# Patient Record
Sex: Male | Born: 1940 | Race: White | Hispanic: No | Marital: Married | Smoking: Never smoker
Health system: Southern US, Community
[De-identification: ages and names within clinical notes are randomized; demographics above are authoritative.]

## PROBLEM LIST (undated history)

## (undated) DIAGNOSIS — I2510 Atherosclerotic heart disease of native coronary artery without angina pectoris: Secondary | ICD-10-CM

## (undated) DIAGNOSIS — T8859XA Other complications of anesthesia, initial encounter: Secondary | ICD-10-CM

## (undated) DIAGNOSIS — I209 Angina pectoris, unspecified: Secondary | ICD-10-CM

## (undated) DIAGNOSIS — T753XXA Motion sickness, initial encounter: Secondary | ICD-10-CM

## (undated) DIAGNOSIS — I10 Essential (primary) hypertension: Secondary | ICD-10-CM

## (undated) DIAGNOSIS — R112 Nausea with vomiting, unspecified: Secondary | ICD-10-CM

## (undated) DIAGNOSIS — E119 Type 2 diabetes mellitus without complications: Secondary | ICD-10-CM

## (undated) DIAGNOSIS — Z972 Presence of dental prosthetic device (complete) (partial): Secondary | ICD-10-CM

## (undated) DIAGNOSIS — Z9889 Other specified postprocedural states: Secondary | ICD-10-CM

## (undated) DIAGNOSIS — K219 Gastro-esophageal reflux disease without esophagitis: Secondary | ICD-10-CM

## (undated) DIAGNOSIS — E785 Hyperlipidemia, unspecified: Secondary | ICD-10-CM

## (undated) DIAGNOSIS — T4145XA Adverse effect of unspecified anesthetic, initial encounter: Secondary | ICD-10-CM

## (undated) HISTORY — PX: NASAL SINUS SURGERY: SHX719

## (undated) HISTORY — PX: CARDIAC CATHETERIZATION: SHX172

## (undated) HISTORY — PX: CORONARY ANGIOPLASTY: SHX604

## (undated) HISTORY — PX: BACK SURGERY: SHX140

## (undated) HISTORY — PX: TONSILLECTOMY: SUR1361

## (undated) HISTORY — PX: COLONOSCOPY: SHX174

---
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
## 2005-07-30 ENCOUNTER — Ambulatory Visit: Payer: Self-pay | Admitting: Unknown Physician Specialty

## 2010-08-27 ENCOUNTER — Ambulatory Visit: Payer: Self-pay | Admitting: Unknown Physician Specialty

## 2010-08-28 LAB — PATHOLOGY REPORT

## 2014-02-16 ENCOUNTER — Ambulatory Visit: Payer: Self-pay | Admitting: Physician Assistant

## 2014-02-19 ENCOUNTER — Ambulatory Visit: Payer: Self-pay | Admitting: Family Medicine

## 2014-02-23 LAB — WOUND CULTURE

## 2014-09-09 ENCOUNTER — Other Ambulatory Visit: Payer: Self-pay | Admitting: Cardiology

## 2014-09-10 ENCOUNTER — Encounter
Admission: RE | Disposition: A | Discharge status: Home / Self Care | Payer: Self-pay | Source: Ambulatory Visit | Attending: Cardiology

## 2014-09-10 SURGERY — LEFT HEART CATH AND CORONARY ANGIOGRAPHY
Anesthesia: LOCAL

## 2014-09-10 NOTE — H&P (Signed)
Progress Notes   Isaac Ruiz (MR# [id number])        Progress Notes Info     Author Note Status Last Update User Last Update Date/Time   Isaac Cowman, MD Signed Isaac Cowman, MD Mon Sep 09, 2014 9:59 AM    Progress Notes    Expand All Collapse All   Established Patient Visit   Chief Complaint: Chief Complaint  Patient presents with  . Follow-up    ETT  . Chest Pain    still has cp could not hardly stand it   Date of Service: 09/09/2014 Date of Birth: 1940-12-23 PCP: Isaac Lema, MD  History of Present Illness: Mr. Isaac Ruiz is a 74 y.o.male patient who returns for coronary artery disease, hyperlipidemia, essential hypertension and type 2 diabetes. The patient is status post angioplasty August, 2001 and be BMS D1 and PDA 06/28/2000. Patient was seen 08/26/2014 with 3 week history of recurrent episodes of chest pain. During one episode the patient was evaluated at [facility] and [place]. The patient was started on isosorbide mononitrate 30 mg daily which has helped. However, the patient continued experience recurrent episodes of chest discomfort, typically after exertion, relieved with sublingual nitroglycerin. The patient describes substernal chest discomfort, associated with shortness of breath, without radiation, nausea, vomiting or diaphoresis. The patient underwent ETT sestamibi study on 08/29/2014, exercise 6 minutes and 1 seconds on a Bruce protocol, with mild chest pain without ECG changes. Gated scintigraphy revealed LV ejection fraction of 54%. SPECT imaging revealed a large fixed inferior wall defect without evidence for ischemia.  The patient has essential hypertension, blood pressure under good control today, only on lisinopril, tolerated well without apparent side effects. The patient tries to adhere to a no added salt diet.  The patient has hyperlipidemia, currently diet controlled.  The patient has type 2  diabetes, on oral agents, taking glipizide and Januvia, which are well tolerated without apparent side effects. The patient currently is not experiencing any symptoms directly related to diabetes. The patient adheres to a low carbohydrate, diabetic diet.  Past Medical and Surgical History  Past Medical History Past Medical History  Diagnosis Date  . GERD (gastroesophageal reflux disease)   . Vitamin D deficiency disease   . CAD (coronary artery disease)   . Diabetes mellitus type 2, uncomplicated   . Hypertension   . Pure hypercholesterolemia 01/04/2012    Followed by Dr. Arther Ruiz, does not tolerate medications.    Past Surgical History He has past surgical history that includes Coronary angioplasty; Laminectomy; and Sinus surgery.   Medications and Allergies  Current Medications   Current Medications    Current Outpatient Prescriptions  Medication Sig Dispense Refill  . aspirin 81 mg tablet Take 81 mg by mouth daily.    . blood glucose diagnostic test strip Use 1 each once daily. ACCU-CHEK AVIVA PLUS 100 each 99  . GLIPIZIDE ORAL Take 20 mg by mouth 2 (two) times daily. Take 1 tablets in the morning and two tablets at night- [facility] medication    . isosorbide mononitrate (IMDUR) 30 MG ER tablet Take 1 tablet (30 mg total) by mouth once daily. 30 tablet 6  . lisinopril (PRINIVIL,ZESTRIL) 40 MG tablet Take 40 mg by mouth daily.    . nitroGLYcerin (NITROSTAT) 0.4 MG SL tablet Place 1 tablet (0.4 mg total) under the tongue every 5 (five) minutes as needed for Chest pain. May take up to 3 doses. 25 tablet 11  . pantoprazole (PROTONIX) 20 MG DR  tablet Take 20 mg by mouth.    . ranitidine (ZANTAC) 150 MG tablet Take 150 mg by mouth as needed.    . SENNOSIDES ORAL Take by mouth. BID from GI    . sitaGLIPtin (JANUVIA) 100 MG tablet Take 100 mg by mouth once daily. [facility] medication- HOLD per patient.     No  current facility-administered medications for this visit.      Allergies: Atenolol; Cardura; Fenofibrate; Fluvastatin; Glucophage; Hydrochlorothiazide; Lipitor; Metoprolol; Niacin; Pioglitazone; Pravastatin; Rosuvastatin; Simvastatin; Vitamin d2; and Zetia  Social and Family History  Social History  reports that he has never smoked. He has never used smokeless tobacco. He reports that he does not drink alcohol or use illicit drugs.  Family History Family History  Problem Relation Age of Onset  . Breast cancer Mother   . Coronary artery disease Father     Review of Systems   Review of Systems: The patient reports recurrent exertional chest pain, associated with shortness of breath, without orthopnea, paroxysmal nocturnal dyspnea, pedal edema, palpitations, heart racing, presyncope, syncope. Review of 12 Systems is negative except as described above.  Physical Examination   Vitals:BP 132/70 mmHg  Pulse 56  Ht 175.3 cm (5\' 9" )  Wt 78.019 kg (172 lb)  BMI 25.39 kg/m2 Ht:175.3 cm (5\' 9" ) Wt:78.019 kg (172 lb) JKK:XFGH surface area is 1.95 meters squared. Body mass index is 25.39 kg/(m^2).  HEENT: Pupils equally reactive to light and accomodation Neck: Supple without thyromegaly, carotid pulses 2+ Lungs: clear to auscultation bilaterally; no wheezes, rales, rhonchi Heart: Regular rate and rhythm. No gallops, murmurs or rub Abdomen: soft nontender, nondistended, with normal bowel sounds Extremities: no cyanosis, clubbing, or edema Peripheral Pulses: 2+ in all extremities, 2+ femoral pulses bilaterally  Assessment   74 y.o. male with  1. Essential hypertension with goal blood pressure less than 140/90   2. Essential hypertension   3. Coronary artery disease involving native coronary artery of native heart with unstable angina pectoris   4. Diabetes mellitus type 2, uncomplicated   5. Pure hypercholesterolemia- chronic, intolerant of meds,  has even seen dr. Arther Ruiz   6. Pure hypercholesterolemia   7. H/O angioplasty    74 year old gentleman with known coronary artery disease, status post prior PTCA and stents, with recurrent episodes of exertional chest pain. ETT sestamibi study revealed a large fixed inferior wall defect without significant ischemia. The patient has [place] class 3 chest pain with moderate risk stress test result. The patient has essential hypertension, blood pressure under good control on current medications which a tolerated well without apparent side effects. The patient has type 2 diabetes, currently on oral agents, adhering to a low carbohydrate diet.    Plan   1. Continue current medications 2. Increase isosorbide mononitrate to 30 mg b.i.d. 3. Counseled patient about low-sodium diet 4. DASH diet printed instructions given to the patient 5. Counseled patient about low-cholesterol diet 6. Low-fat and cholesterol diet printed instructions given to the patient 7. Diabetes diet printed instructions given to the patient 8. Proceed with cardiac catheterization with selective coronary arteriography. The risks, benefits alternatives of cardiac catheterization were explained to the patient and informed written consent was obtained.  No orders of the defined types were placed in this encounter.   No Follow-up on file.  Isaac Ruiz, [facility]   [address] [place] [address]    Department  Name Address Phone [facility] [place] [address] [phone]     Progress Notes   Isaac Ruiz (MR# [id number])        Progress Notes Info     Author Note Status Last Update User Last Update Date/Time   Isaac Cowman, MD Signed Isaac Cowman, MD Mon Sep 09, 2014 9:59 AM    Progress Notes    Expand All Collapse All   Established Patient Visit   Chief  Complaint: Chief Complaint  Patient presents with  . Follow-up    ETT  . Chest Pain    still has cp could not hardly stand it   Date of Service: 09/09/2014 Date of Birth: February 08, 1941 PCP: Isaac Lema, MD  History of Present Illness: Mr. Stroschein is a 74 y.o.male patient who returns for coronary artery disease, hyperlipidemia, essential hypertension and type 2 diabetes. The patient is status post angioplasty August, 2001 and be BMS D1 and PDA 06/28/2000. Patient was seen 08/26/2014 with 3 week history of recurrent episodes of chest pain. During one episode the patient was evaluated at [facility] and [place]. The patient was started on isosorbide mononitrate 30 mg daily which has helped. However, the patient continued experience recurrent episodes of chest discomfort, typically after exertion, relieved with sublingual nitroglycerin. The patient describes substernal chest discomfort, associated with shortness of breath, without radiation, nausea, vomiting or diaphoresis. The patient underwent ETT sestamibi study on 08/29/2014, exercise 6 minutes and 1 seconds on a Bruce protocol, with mild chest pain without ECG changes. Gated scintigraphy revealed LV ejection fraction of 54%. SPECT imaging revealed a large fixed inferior wall defect without evidence for ischemia.  The patient has essential hypertension, blood pressure under good control today, only on lisinopril, tolerated well without apparent side effects. The patient tries to adhere to a no added salt diet.  The patient has hyperlipidemia, currently diet controlled.  The patient has type 2 diabetes, on oral agents, taking glipizide and Januvia, which are well tolerated without apparent side effects. The patient currently is not experiencing any symptoms directly related to diabetes. The patient adheres to a low carbohydrate, diabetic diet.  Past Medical and Surgical History  Past Medical History Past  Medical History  Diagnosis Date  . GERD (gastroesophageal reflux disease)   . Vitamin D deficiency disease   . CAD (coronary artery disease)   . Diabetes mellitus type 2, uncomplicated   . Hypertension   . Pure hypercholesterolemia 01/04/2012    Followed by Dr. Arther Ruiz, does not tolerate medications.    Past Surgical History He has past surgical history that includes Coronary angioplasty; Laminectomy; and Sinus surgery.   Medications and Allergies  Current Medications   Current Medications    Current Outpatient Prescriptions  Medication Sig Dispense Refill  . aspirin 81 mg tablet Take 81 mg by mouth daily.    . blood glucose diagnostic test strip Use 1 each once daily. ACCU-CHEK AVIVA PLUS 100 each 99  . GLIPIZIDE ORAL Take 20 mg by mouth 2 (two) times daily. Take 1 tablets in the morning and two tablets at night- [facility] medication    . isosorbide mononitrate (IMDUR) 30 MG ER tablet Take 1 tablet (30 mg total) by mouth once daily. 30 tablet 6  . lisinopril (PRINIVIL,ZESTRIL) 40 MG tablet Take 40 mg by mouth daily.    . nitroGLYcerin (NITROSTAT) 0.4 MG SL tablet Place 1 tablet (0.4 mg total) under the tongue every 5 (five)  minutes as needed for Chest pain. May take up to 3 doses. 25 tablet 11  . pantoprazole (PROTONIX) 20 MG DR tablet Take 20 mg by mouth.    . ranitidine (ZANTAC) 150 MG tablet Take 150 mg by mouth as needed.    . SENNOSIDES ORAL Take by mouth. BID from GI    . sitaGLIPtin (JANUVIA) 100 MG tablet Take 100 mg by mouth once daily. [facility] medication- HOLD per patient.     No current facility-administered medications for this visit.      Allergies: Atenolol; Cardura; Fenofibrate; Fluvastatin; Glucophage; Hydrochlorothiazide; Lipitor; Metoprolol; Niacin; Pioglitazone; Pravastatin; Rosuvastatin; Simvastatin; Vitamin d2; and Zetia  Social and Family History  Social History  reports that he  has never smoked. He has never used smokeless tobacco. He reports that he does not drink alcohol or use illicit drugs.  Family History Family History  Problem Relation Age of Onset  . Breast cancer Mother   . Coronary artery disease Father     Review of Systems   Review of Systems: The patient reports recurrent exertional chest pain, associated with shortness of breath, without orthopnea, paroxysmal nocturnal dyspnea, pedal edema, palpitations, heart racing, presyncope, syncope. Review of 12 Systems is negative except as described above.  Physical Examination   Vitals:BP 132/70 mmHg  Pulse 56  Ht 175.3 cm (5\' 9" )  Wt 78.019 kg (172 lb)  BMI 25.39 kg/m2 Ht:175.3 cm (5\' 9" ) Wt:78.019 kg (172 lb) BVQ:XIHW surface area is 1.95 meters squared. Body mass index is 25.39 kg/(m^2).  HEENT: Pupils equally reactive to light and accomodation Neck: Supple without thyromegaly, carotid pulses 2+ Lungs: clear to auscultation bilaterally; no wheezes, rales, rhonchi Heart: Regular rate and rhythm. No gallops, murmurs or rub Abdomen: soft nontender, nondistended, with normal bowel sounds Extremities: no cyanosis, clubbing, or edema Peripheral Pulses: 2+ in all extremities, 2+ femoral pulses bilaterally  Assessment   74 y.o. male with  1. Essential hypertension with goal blood pressure less than 140/90   2. Essential hypertension   3. Coronary artery disease involving native coronary artery of native heart with unstable angina pectoris   4. Diabetes mellitus type 2, uncomplicated   5. Pure hypercholesterolemia- chronic, intolerant of meds, has even seen dr. Arther Ruiz   6. Pure hypercholesterolemia   7. H/O angioplasty    74 year old gentleman with known coronary artery disease, status post prior PTCA and stents, with recurrent episodes of exertional chest pain. ETT sestamibi study revealed a large fixed inferior wall defect without significant ischemia.  The patient has [place] class 3 chest pain with moderate risk stress test result. The patient has essential hypertension, blood pressure under good control on current medications which a tolerated well without apparent side effects. The patient has type 2 diabetes, currently on oral agents, adhering to a low carbohydrate diet.    Plan   1. Continue current medications 2. Increase isosorbide mononitrate to 30 mg b.i.d. 3. Counseled patient about low-sodium diet 4. DASH diet printed instructions given to the patient 5. Counseled patient about low-cholesterol diet 6. Low-fat and cholesterol diet printed instructions given to the patient 7. Diabetes diet printed instructions given to the patient 8. Proceed with cardiac catheterization with selective coronary arteriography. The risks, benefits alternatives of cardiac catheterization were explained to the patient and informed written consent was obtained.  No orders of the defined types were placed in this encounter.   No Follow-up on file.  Isaac Cowman, MD  [facility]   [place] [address]    Department    Name Address Phone Fax   [facility] [place] [address] [phone]

## 2014-09-11 ENCOUNTER — Observation Stay
Admission: RE | Admit: 2014-09-11 | Discharge: 2014-09-12 | Disposition: A | Discharge status: Home / Self Care | Payer: Medicare HMO | Source: Ambulatory Visit | Attending: Cardiology | Admitting: Cardiology

## 2014-09-11 ENCOUNTER — Encounter
Admission: RE | Disposition: A | Discharge status: Home / Self Care | Payer: Medicare HMO | Source: Ambulatory Visit | Attending: Cardiology

## 2014-09-11 ENCOUNTER — Encounter: Payer: Self-pay | Admitting: *Deleted

## 2014-09-11 DIAGNOSIS — Z803 Family history of malignant neoplasm of breast: Secondary | ICD-10-CM | POA: Insufficient documentation

## 2014-09-11 DIAGNOSIS — R079 Chest pain, unspecified: Secondary | ICD-10-CM | POA: Diagnosis not present

## 2014-09-11 DIAGNOSIS — Z8249 Family history of ischemic heart disease and other diseases of the circulatory system: Secondary | ICD-10-CM | POA: Insufficient documentation

## 2014-09-11 DIAGNOSIS — Z9861 Coronary angioplasty status: Secondary | ICD-10-CM | POA: Diagnosis not present

## 2014-09-11 DIAGNOSIS — R0601 Orthopnea: Secondary | ICD-10-CM | POA: Diagnosis not present

## 2014-09-11 DIAGNOSIS — Z79899 Other long term (current) drug therapy: Secondary | ICD-10-CM | POA: Diagnosis not present

## 2014-09-11 DIAGNOSIS — E119 Type 2 diabetes mellitus without complications: Secondary | ICD-10-CM | POA: Diagnosis not present

## 2014-09-11 DIAGNOSIS — E785 Hyperlipidemia, unspecified: Secondary | ICD-10-CM | POA: Insufficient documentation

## 2014-09-11 DIAGNOSIS — K219 Gastro-esophageal reflux disease without esophagitis: Secondary | ICD-10-CM | POA: Diagnosis not present

## 2014-09-11 DIAGNOSIS — I10 Essential (primary) hypertension: Secondary | ICD-10-CM | POA: Diagnosis not present

## 2014-09-11 DIAGNOSIS — Z7982 Long term (current) use of aspirin: Secondary | ICD-10-CM | POA: Insufficient documentation

## 2014-09-11 DIAGNOSIS — E780 Pure hypercholesterolemia: Secondary | ICD-10-CM | POA: Diagnosis not present

## 2014-09-11 DIAGNOSIS — E559 Vitamin D deficiency, unspecified: Secondary | ICD-10-CM | POA: Diagnosis not present

## 2014-09-11 DIAGNOSIS — R0600 Dyspnea, unspecified: Secondary | ICD-10-CM | POA: Diagnosis not present

## 2014-09-11 DIAGNOSIS — I25110 Atherosclerotic heart disease of native coronary artery with unstable angina pectoris: Principal | ICD-10-CM | POA: Diagnosis present

## 2014-09-11 HISTORY — DX: Atherosclerotic heart disease of native coronary artery without angina pectoris: I25.10

## 2014-09-11 HISTORY — DX: Type 2 diabetes mellitus without complications: E11.9

## 2014-09-11 HISTORY — PX: CARDIAC CATHETERIZATION: SHX172

## 2014-09-11 HISTORY — DX: Angina pectoris, unspecified: I20.9

## 2014-09-11 HISTORY — DX: Gastro-esophageal reflux disease without esophagitis: K21.9

## 2014-09-11 HISTORY — DX: Essential (primary) hypertension: I10

## 2014-09-11 SURGERY — LEFT HEART CATH
Anesthesia: Moderate Sedation

## 2014-09-11 MED ORDER — BIVALIRUDIN 250 MG IV SOLR
INTRAVENOUS | Status: AC
  Filled 2014-09-11: qty 250

## 2014-09-11 MED ORDER — GLIPIZIDE 10 MG PO TABS
20.0000 mg | ORAL_TABLET | Freq: Every day | ORAL | Status: DC
  Administered 2014-09-12: 20 mg via ORAL
  Filled 2014-09-11 (×2): qty 2

## 2014-09-11 MED ORDER — CLOPIDOGREL BISULFATE 300 MG PO TABS: ORAL_TABLET | ORAL | Status: DC | PRN

## 2014-09-11 MED ORDER — ASPIRIN 81 MG PO CHEW
CHEWABLE_TABLET | ORAL | Status: AC
  Filled 2014-09-11: qty 4

## 2014-09-11 MED ORDER — FENTANYL CITRATE (PF) 100 MCG/2ML IJ SOLN
INTRAMUSCULAR | Status: DC | PRN
  Administered 2014-09-11: 25 ug via INTRAVENOUS

## 2014-09-11 MED ORDER — MIDAZOLAM HCL 2 MG/2ML IJ SOLN
INTRAMUSCULAR | Status: DC | PRN
  Administered 2014-09-11: 2 mg via INTRAVENOUS

## 2014-09-11 MED ORDER — FENTANYL CITRATE (PF) 100 MCG/2ML IJ SOLN
INTRAMUSCULAR | Status: AC
  Filled 2014-09-11: qty 2

## 2014-09-11 MED ORDER — SODIUM CHLORIDE 0.9 % IJ SOLN: 3.0000 mL | INTRAMUSCULAR | Status: DC | PRN

## 2014-09-11 MED ORDER — SODIUM CHLORIDE 0.9 % IV SOLN
250.0000 mg | INTRAVENOUS | Status: DC | PRN
  Administered 2014-09-11: 1.75 mg/kg/h via INTRAVENOUS

## 2014-09-11 MED ORDER — NITROGLYCERIN 0.2 MG/ML ON CALL CATH LAB
INTRAVENOUS | Status: DC | PRN
  Administered 2014-09-11 (×3): 200 ug via INTRAVENOUS

## 2014-09-11 MED ORDER — CLOPIDOGREL BISULFATE 75 MG PO TABS
ORAL_TABLET | ORAL | Status: AC
  Filled 2014-09-11: qty 8

## 2014-09-11 MED ORDER — ISOSORBIDE MONONITRATE ER 30 MG PO TB24
30.0000 mg | ORAL_TABLET | Freq: Every day | ORAL | Status: DC
  Administered 2014-09-12: 30 mg via ORAL
  Filled 2014-09-11 (×3): qty 1

## 2014-09-11 MED ORDER — SODIUM CHLORIDE 0.9 % IV SOLN: INTRAVENOUS | Status: DC

## 2014-09-11 MED ORDER — LISINOPRIL 20 MG PO TABS
40.0000 mg | ORAL_TABLET | Freq: Every day | ORAL | Status: DC
  Administered 2014-09-12: 40 mg via ORAL
  Filled 2014-09-11: qty 2
  Filled 2014-09-11 (×2): qty 1

## 2014-09-11 MED ORDER — CLOPIDOGREL BISULFATE 75 MG PO TABS
ORAL_TABLET | ORAL | Status: DC | PRN
  Administered 2014-09-11: 600 mg via ORAL

## 2014-09-11 MED ORDER — MIDAZOLAM HCL 2 MG/2ML IJ SOLN
INTRAMUSCULAR | Status: AC
  Filled 2014-09-11: qty 2

## 2014-09-11 MED ORDER — BIVALIRUDIN BOLUS VIA INFUSION
INTRAVENOUS | Status: DC | PRN
  Administered 2014-09-11: 59.85 mg via INTRAVENOUS

## 2014-09-11 MED ORDER — ONDANSETRON HCL 4 MG/2ML IJ SOLN: 4.0000 mg | Freq: Four times a day (QID) | INTRAMUSCULAR | Status: DC | PRN

## 2014-09-11 MED ORDER — HEPARIN (PORCINE) IN NACL 2-0.9 UNIT/ML-% IJ SOLN
INTRAMUSCULAR | Status: AC
  Filled 2014-09-11: qty 1000

## 2014-09-11 MED ORDER — CLOPIDOGREL BISULFATE 75 MG PO TABS
75.0000 mg | ORAL_TABLET | Freq: Every day | ORAL | Status: DC
  Administered 2014-09-12: 75 mg via ORAL
  Filled 2014-09-11: qty 1

## 2014-09-11 MED ORDER — NITROGLYCERIN 5 MG/ML IV SOLN
INTRAVENOUS | Status: AC
  Filled 2014-09-11: qty 10

## 2014-09-11 MED ORDER — ASPIRIN 81 MG PO CHEW
CHEWABLE_TABLET | ORAL | Status: DC | PRN
  Administered 2014-09-11: 324 mg via ORAL

## 2014-09-11 MED ORDER — SODIUM CHLORIDE 0.9 % IV SOLN: 250.0000 mL | INTRAVENOUS | Status: DC | PRN

## 2014-09-11 MED ORDER — SODIUM CHLORIDE 0.9 % IJ SOLN: 3.0000 mL | Freq: Two times a day (BID) | INTRAMUSCULAR | Status: DC

## 2014-09-11 MED ORDER — ACETAMINOPHEN 325 MG PO TABS: 650.0000 mg | ORAL_TABLET | ORAL | Status: DC | PRN

## 2014-09-11 MED ORDER — ASPIRIN EC 325 MG PO TBEC
325.0000 mg | DELAYED_RELEASE_TABLET | Freq: Every day | ORAL | Status: DC
  Administered 2014-09-11 – 2014-09-12 (×2): 325 mg via ORAL
  Filled 2014-09-11 (×2): qty 1

## 2014-09-11 MED ORDER — SODIUM CHLORIDE 0.9 % WEIGHT BASED INFUSION: 3.0000 mL/kg/h | INTRAVENOUS | Status: AC

## 2014-09-11 SURGICAL SUPPLY — 17 items
BALLN TREK RX 2.5X12 (BALLOONS) ×2
BALLOON TREK RX 2.5X12 (BALLOONS) ×1 IMPLANT
CATH DIAG 6FR JL4 (CATHETERS) ×2 IMPLANT
CATH INFINITI 5FR ANG PIGTAIL (CATHETERS) ×2 IMPLANT
CATH INFINITI JR4 5F (CATHETERS) ×2 IMPLANT
CATH VISTA GUIDE 6FR XB3.5 (CATHETERS) ×2 IMPLANT
CATH XBL 3.5 8X100 (CATHETERS) ×2 IMPLANT
DEVICE CLOSURE MYNXGRIP 6/7F (Vascular Products) ×2 IMPLANT
DEVICE INFLAT 30 PLUS (MISCELLANEOUS) ×2 IMPLANT
KIT MANI 3VAL PERCEP (MISCELLANEOUS) ×2 IMPLANT
NEEDLE PERC 18GX7CM (NEEDLE) ×2 IMPLANT
PACK CARDIAC CATH (CUSTOM PROCEDURE TRAY) ×2 IMPLANT
SHEATH PINNACLE 5F 10CM (SHEATH) ×2 IMPLANT
SHEATH PINNACLE 6F 10CM (SHEATH) ×2 IMPLANT
STENT XIENCE ALPINE RX 3.0X15 (Permanent Stent) ×2 IMPLANT
WIRE ASAHI PROWATER 180CM (WIRE) ×2 IMPLANT
WIRE J 3MM .035X145CM (WIRE) ×2 IMPLANT

## 2014-09-11 NOTE — Progress Notes (Signed)
Report given to care nurse [id number] with plan reviewed, daughter present with pt and aware, no bleeding nor hematoma at right groin site.

## 2014-09-12 DIAGNOSIS — I25110 Atherosclerotic heart disease of native coronary artery with unstable angina pectoris: Secondary | ICD-10-CM | POA: Diagnosis not present

## 2014-09-12 LAB — CBC
HCT: 38.3 % — ABNORMAL LOW (ref 40.0–52.0)
Hemoglobin: 12.8 g/dL — ABNORMAL LOW (ref 13.0–18.0)
MCH: 28 pg (ref 26.0–34.0)
MCHC: 33.4 g/dL (ref 32.0–36.0)
MCV: 84 fL (ref 80.0–100.0)
PLATELETS: 141 10*3/uL — AB (ref 150–440)
RBC: 4.56 MIL/uL (ref 4.40–5.90)
RDW: 13.6 % (ref 11.5–14.5)
WBC: 6.8 10*3/uL (ref 3.8–10.6)

## 2014-09-12 LAB — BASIC METABOLIC PANEL
Anion gap: 6 (ref 5–15)
BUN: 15 mg/dL (ref 6–20)
CALCIUM: 8.9 mg/dL (ref 8.9–10.3)
CO2: 26 mmol/L (ref 22–32)
Chloride: 106 mmol/L (ref 101–111)
Creatinine, Ser: 0.97 mg/dL (ref 0.61–1.24)
GFR calc Af Amer: >60 mL/min (ref >60)
GFR calc non Af Amer: >60 mL/min (ref >60)
GLUCOSE: 158 mg/dL — AB (ref 65–99)
Potassium: 4.3 mmol/L (ref 3.5–5.1)
SODIUM: 138 mmol/L (ref 135–145)

## 2014-09-12 MED ORDER — ASPIRIN 325 MG PO TBEC: 325.0000 mg | DELAYED_RELEASE_TABLET | Freq: Every day | ORAL | Status: DC

## 2014-09-12 MED ORDER — CLOPIDOGREL BISULFATE 75 MG PO TABS: 75.0000 mg | ORAL_TABLET | Freq: Every day | ORAL | Status: DC

## 2014-09-12 NOTE — Discharge Instructions (Signed)
Avoid heavy lifting or exertion

## 2014-09-12 NOTE — Progress Notes (Signed)
Patient given discharge teaching and paperwork regarding medications, diet, follow-up appointments and activity. Patient understanding verbalized. No complaints at this time. IV and telemetry discontinued. Skin assessment as previously charted and vitals are stable. Patient being discharged to home. Caregiver/family present during discharge teaching.  Isaac Ruiz, Isaac Ruiz  

## 2014-09-12 NOTE — Progress Notes (Signed)
Pt remain alert and verbally responsive, ambulating in hallway during this shift, no c/o pain or discomfort, no visible sigh of distress noted, no bleeding, hematoma noted at procedure sight to right groin.Resting quietly in bed at this time no visible sign of distress noted.

## 2014-09-12 NOTE — Care Management (Signed)
Admitted s/p cardiac stent.  Patient is being discharged on Plavix.  This will not present any financial concerns

## 2014-09-12 NOTE — Discharge Summary (Signed)
   Physician Discharge Summary  Patient ID: Isaac Ruiz MRN: [id number] DOB/AGE: October 31, 1940 74 y.o.  Admit date: 09/11/2014 Discharge date: 09/12/2014  Primary Discharge Diagnosis Coronary artery disease Secondary Discharge Diagnosis  Unstable angina  Significant Diagnostic Studies: angiography:  Percutaneous coronary intervention   Consults: cardiology  Hospital Course:  The patient underwent cardiac catheterization.  Elective coronary arteriography revealed 50% stenosis mid LAD, 70% stenosis distal LAD, 70% stenosis mid RCA, and high-grade 95% stenosis in the proximal segment left circumflex.  Patient underwent percutaneous coronary intervention, receiving a 3.0 by 15 mm Xience drug-eluting stent in the proximal segment left circumflex with excellent angiographic result.  Patient was returned to the CCU where he had an uncomplicated hospital stay.  On the morning of 09/12/2014 the patient was chest pain-free, ambulating without difficulty and was discharged home.   Discharge Exam: Blood pressure 158/78, pulse 48, temperature 97.7 F (36.5 C), temperature source Oral, resp. rate 17, height 5\' 9"  (1.753 m), weight 78.291 kg (172 lb 9.6 oz), SpO2 98 %.   General appearance: alert Labs:   Lab Results  Component Value Date   WBC 6.8 09/12/2014   HGB 12.8* 09/12/2014   HCT 38.3* 09/12/2014   MCV 84.0 09/12/2014   PLT 141* 09/12/2014    Recent Labs Lab 09/12/14 0416  NA 138  K 4.3  CL 106  CO2 26  BUN 15  CREATININE 0.97  CALCIUM 8.9  GLUCOSE 158*      Radiology:  EKG:  FOLLOW UP PLANS AND APPOINTMENTS Discharge Instructions    Discharge patient    Complete by:  As directed             Medication List    TAKE these medications        aspirin 325 MG EC tablet  Take 1 tablet (325 mg total) by mouth daily.     clopidogrel 75 MG tablet  Commonly known as:  PLAVIX  Take 1 tablet (75 mg total) by mouth daily with breakfast.     glipiZIDE 10 MG tablet   Commonly known as:  GLUCOTROL  Take 20 mg by mouth daily before breakfast.     isosorbide mononitrate 30 MG 24 hr tablet  Commonly known as:  IMDUR  Take 30 mg by mouth daily.     lisinopril 40 MG tablet  Commonly known as:  PRINIVIL,ZESTRIL  Take 40 mg by mouth daily.           Follow-up Information    Follow up with Isaias Cowman, MD. Go on 09/18/2014.   Specialty:  Cardiology   Why:  at 10:45am.   Contact information:   [place] [phone] [place] TO FOLLOW UP APPOINTMENTS  Time spent with patient to include physician time: 45 min Signed:  Isaias Cowman MD, PhD, [facility] 09/12/2014, 9:30 AM

## 2014-09-17 ENCOUNTER — Encounter: Payer: Self-pay | Admitting: Cardiology

## 2014-09-25 ENCOUNTER — Telehealth: Payer: Self-pay | Admitting: *Deleted

## 2014-09-25 NOTE — Telephone Encounter (Signed)
Left message for Isaac Ruiz to call [facility] to set up orientation appointment.

## 2015-09-26 ENCOUNTER — Encounter: Payer: Self-pay | Admitting: *Deleted

## 2015-09-29 ENCOUNTER — Encounter: Payer: Self-pay | Admitting: *Deleted

## 2015-09-29 ENCOUNTER — Encounter
Admission: RE | Disposition: A | Discharge status: Home / Self Care | Payer: Self-pay | Source: Ambulatory Visit | Attending: Unknown Physician Specialty

## 2015-09-29 ENCOUNTER — Ambulatory Visit: Payer: Medicare HMO | Admitting: Anesthesiology

## 2015-09-29 ENCOUNTER — Ambulatory Visit
Admission: RE | Admit: 2015-09-29 | Discharge: 2015-09-29 | Disposition: A | Discharge status: Home / Self Care | Payer: Medicare HMO | Source: Ambulatory Visit | Attending: Unknown Physician Specialty | Admitting: Unknown Physician Specialty

## 2015-09-29 DIAGNOSIS — Z79899 Other long term (current) drug therapy: Secondary | ICD-10-CM | POA: Insufficient documentation

## 2015-09-29 DIAGNOSIS — Z1211 Encounter for screening for malignant neoplasm of colon: Secondary | ICD-10-CM | POA: Insufficient documentation

## 2015-09-29 DIAGNOSIS — I25119 Atherosclerotic heart disease of native coronary artery with unspecified angina pectoris: Secondary | ICD-10-CM | POA: Insufficient documentation

## 2015-09-29 DIAGNOSIS — E119 Type 2 diabetes mellitus without complications: Secondary | ICD-10-CM | POA: Diagnosis not present

## 2015-09-29 DIAGNOSIS — K5730 Diverticulosis of large intestine without perforation or abscess without bleeding: Secondary | ICD-10-CM | POA: Diagnosis not present

## 2015-09-29 DIAGNOSIS — Z888 Allergy status to other drugs, medicaments and biological substances status: Secondary | ICD-10-CM | POA: Diagnosis not present

## 2015-09-29 DIAGNOSIS — K219 Gastro-esophageal reflux disease without esophagitis: Secondary | ICD-10-CM | POA: Diagnosis not present

## 2015-09-29 DIAGNOSIS — K640 First degree hemorrhoids: Secondary | ICD-10-CM | POA: Diagnosis not present

## 2015-09-29 DIAGNOSIS — Z7982 Long term (current) use of aspirin: Secondary | ICD-10-CM | POA: Diagnosis not present

## 2015-09-29 DIAGNOSIS — I10 Essential (primary) hypertension: Secondary | ICD-10-CM | POA: Insufficient documentation

## 2015-09-29 HISTORY — PX: COLONOSCOPY WITH PROPOFOL: SHX5780

## 2015-09-29 HISTORY — DX: Hyperlipidemia, unspecified: E78.5

## 2015-09-29 SURGERY — COLONOSCOPY WITH PROPOFOL
Anesthesia: General

## 2015-09-29 MED ORDER — SODIUM CHLORIDE 0.9 % IV SOLN
INTRAVENOUS | Status: DC
  Administered 2015-09-29: 10:00:00 via INTRAVENOUS

## 2015-09-29 MED ORDER — SODIUM CHLORIDE 0.9 % IV SOLN: INTRAVENOUS | Status: DC

## 2015-09-29 MED ORDER — PROPOFOL 10 MG/ML IV BOLUS
INTRAVENOUS | Status: DC | PRN
  Administered 2015-09-29: 100 mg via INTRAVENOUS

## 2015-09-29 MED ORDER — PROPOFOL 500 MG/50ML IV EMUL
INTRAVENOUS | Status: DC | PRN
  Administered 2015-09-29: 120 ug/kg/min via INTRAVENOUS

## 2015-09-29 NOTE — H&P (Signed)
Primary Care Physician:  [facility] Primary Gastroenterologist:  Dr. Vira Agar  Pre-Procedure History & Physical: HPI:  Isaac Ruiz is a 75 y.o. male is here for an colonoscopy.   Past Medical History  Diagnosis Date  . Coronary artery disease   . Anginal pain ([place])   . Hypertension   . Diabetes mellitus without complication ([place])   . GERD (gastroesophageal reflux disease)   . Lipids blood increased     Past Surgical History  Procedure Laterality Date  . Cardiac catheterization    . Back surgery    . Cardiac catheterization N/A 09/11/2014    Procedure: Left Heart Cath;  Surgeon: Isaias Cowman, MD;  Location: [facility];  Service: Cardiovascular;  Laterality: N/A;  . Cardiac catheterization N/A 09/11/2014    Procedure: Coronary Stent Intervention;  Surgeon: Isaias Cowman, MD;  Location: [facility];  Service: Cardiovascular;  Laterality: N/A;  . Coronary angioplasty    . Nasal sinus surgery    . Colonoscopy    . Tonsillectomy      Prior to Admission medications   Medication Sig Start Date End Date Taking? Authorizing Provider  lisinopril (PRINIVIL,ZESTRIL) 40 MG tablet Take 40 mg by mouth daily.   Yes Historical Provider, MD  aspirin EC 325 MG EC tablet Take 1 tablet (325 mg total) by mouth daily. 09/12/14   Isaias Cowman, MD  clopidogrel (PLAVIX) 75 MG tablet Take 1 tablet (75 mg total) by mouth daily with breakfast. 09/12/14   Isaias Cowman, MD  glipiZIDE (GLUCOTROL) 10 MG tablet Take 20 mg by mouth daily before breakfast.    Historical Provider, MD  isosorbide mononitrate (IMDUR) 30 MG 24 hr tablet Take 30 mg by mouth daily.    Historical Provider, MD    Allergies as of 09/18/2015 - Review Complete 09/11/2014  Allergen Reaction Noted  . Atenolol Shortness Of Breath 09/11/2014  . Cardura [doxazosin] Other (See Comments) 09/11/2014  . Fenofibrate Itching 09/11/2014  . Glucophage [metformin hcl] Other (See  Comments) 09/11/2014  . Hydrochlorothiazide Other (See Comments) 09/11/2014  . Lipitor [atorvastatin] Other (See Comments) 09/11/2014  . Metoprolol  09/11/2014  . Pioglitazone Dermatitis 09/11/2014  . Pravastatin Other (See Comments) 09/11/2014  . Rosuvastatin  09/11/2014  . Vitamin d analogs  09/11/2014  . Zetia [ezetimibe]  09/11/2014    History reviewed. No pertinent family history.  Social History   Social History  . Marital Status: Married    Spouse Name: N/A  . Number of Children: N/A  . Years of Education: N/A   Occupational History  . Not on file.   Social History Main Topics  . Smoking status: Never Smoker   . Smokeless tobacco: Never Used  . Alcohol Use: No  . Drug Use: No  . Sexual Activity: Not on file   Other Topics Concern  . Not on file   Social History Narrative    Review of Systems: See HPI, otherwise negative ROS  Physical Exam: BP 134/64 mmHg  Pulse 62  Temp(Src) 98.6 F (37 C) (Tympanic)  Resp 16  Ht 5\' 9"  (1.753 m)  Wt 77.111 kg (170 lb)  BMI 25.09 kg/m2  SpO2 100% General:   Alert,  pleasant and cooperative in NAD Head:  Normocephalic and atraumatic. Neck:  Supple; no masses or thyromegaly. Lungs:  Clear throughout to auscultation.    Heart:  Regular rate and rhythm. Abdomen:  Soft, nontender and nondistended. Normal bowel sounds, without guarding, and without rebound.  Neurologic:  Alert and  oriented x4;  grossly normal neurologically.  Impression/Plan: Isaac Ruiz is here for an colonoscopy to be performed for [facility] colon polyps  Risks, benefits, limitations, and alternatives regarding  colonoscopy have been reviewed with the patient.  Questions have been answered.  All parties agreeable.   Gaylyn Cheers, MD  09/29/2015, 10:35 AM

## 2015-09-29 NOTE — Anesthesia Postprocedure Evaluation (Signed)
Anesthesia Post Note  Patient: Isaac Ruiz  Procedure(s) Performed: Procedure(s) (LRB): COLONOSCOPY WITH PROPOFOL (N/A)  Patient location during evaluation: PACU Anesthesia Type: General Level of consciousness: awake and alert Pain management: pain level controlled Vital Signs Assessment: post-procedure vital signs reviewed and stable Respiratory status: spontaneous breathing, nonlabored ventilation, respiratory function stable and patient connected to nasal cannula oxygen Cardiovascular status: blood pressure returned to baseline and stable Postop Assessment: no signs of nausea or vomiting Anesthetic complications: no    Last Vitals:  Filed Vitals:   09/29/15 1128 09/29/15 1138  BP: 101/55 115/62  Pulse: 56 53  Temp:    Resp: 13 11    Last Pain: There were no vitals filed for this visit.               Molli Barrows

## 2015-09-29 NOTE — Transfer of Care (Signed)
Immediate Anesthesia Transfer of Care Note  Patient: Isaac Ruiz  Procedure(s) Performed: Procedure(s): COLONOSCOPY WITH PROPOFOL (N/A)  Patient Location: PACU and Endoscopy Unit  Anesthesia Type:General  Level of Consciousness: awake, alert  and oriented  Airway & Oxygen Therapy: Patient Spontanous Breathing and Patient connected to nasal cannula oxygen  Post-op Assessment: Report given to RN and Post -op Vital signs reviewed and stable  Post vital signs: Reviewed and stable  Last Vitals:  Filed Vitals:   09/29/15 0950 09/29/15 1118  BP: 134/64 100/55  Pulse: 62   Temp: 37 C   Resp: 16 16    Last Pain: There were no vitals filed for this visit.       Complications: No apparent anesthesia complications

## 2015-09-29 NOTE — Op Note (Signed)
[facility] Patient Name: Isaac Ruiz Procedure Date: 09/29/2015 10:40 AM MRN: [id number] Account #: [id number] Date of Birth: [birth year] Admit Type: Outpatient Age: 75 Room: [facility] ENDO ROOM 1 Gender: Male Note Status: Finalized Procedure:            Colonoscopy Indications:          High risk colon cancer surveillance: Personal history                        of colonic polyps Providers:            Manya Silvas, MD Referring MD:         Nelda Severe. Allred (Referring MD) Medicines:            Propofol per Anesthesia Complications:        No immediate complications. Procedure:            Pre-Anesthesia Assessment:                       - After reviewing the risks and benefits, the patient                        was deemed in satisfactory condition to undergo the                        procedure.                       After obtaining informed consent, the colonoscope was                        passed under direct vision. Throughout the procedure,                        the patient's blood pressure, pulse, and oxygen                        saturations were monitored continuously. The                        Colonoscope was introduced through the anus and                        advanced to the the cecum, identified by appendiceal                        orifice and ileocecal valve. The colonoscopy was                        technically difficult and complex due to a tortuous                        colon. Successful completion of the procedure was aided                        by applying abdominal pressure. The patient tolerated                        the procedure well. The quality of the bowel  preparation was good. Findings:      Multiple small and large-mouthed diverticula were found in the sigmoid       colon.      Internal hemorrhoids were found during endoscopy. The hemorrhoids were       medium-sized and Grade I  (internal hemorrhoids that do not prolapse).      The exam was otherwise without abnormality. Impression:           - Diverticulosis in the sigmoid colon.                       - Internal hemorrhoids.                       - The examination was otherwise normal.                       - No specimens collected. Recommendation:       - The findings and recommendations were discussed with                        the patient.and family. Manya Silvas, MD 09/29/2015 11:10:01 AM This report has been signed electronically. Number of Addenda: 0 Note Initiated On: 09/29/2015 10:40 AM Scope Withdrawal Time: 0 hours 9 minutes 2 seconds  Total Procedure Duration: 0 hours 21 minutes 21 seconds       [facility]

## 2015-09-29 NOTE — Anesthesia Preprocedure Evaluation (Signed)
Anesthesia Evaluation  Patient identified by MRN, date of birth, ID band Patient awake    Reviewed: Allergy & Precautions, H&P , NPO status , Patient's Chart, lab work & pertinent test results, reviewed documented beta blocker date and time   Airway Mallampati: II   Neck ROM: full    Dental  (+) Teeth Intact, Partial Lower   Pulmonary neg pulmonary ROS,    Pulmonary exam normal        Cardiovascular hypertension, + angina with exertion + CAD  negative cardio ROS Normal cardiovascular exam Rhythm:regular Rate:Normal     Neuro/Psych negative neurological ROS  negative psych ROS   GI/Hepatic negative GI ROS, Neg liver ROS, GERD  ,  Endo/Other  negative endocrine ROSdiabetes  Renal/GU negative Renal ROS  negative genitourinary   Musculoskeletal   Abdominal   Peds  Hematology negative hematology ROS (+)   Anesthesia Other Findings Past Medical History:   Coronary artery disease                                      Anginal pain (HCC)                                           Hypertension                                                 Diabetes mellitus without complication (HCC)                 GERD (gastroesophageal reflux disease)                       Lipids blood increased                                     Past Surgical History:   CARDIAC CATHETERIZATION                                       BACK SURGERY                                                  CARDIAC CATHETERIZATION                         N/A 09/11/2014       Comment:Procedure: Left Heart Cath;  Surgeon: Isaias Cowman, MD;  Location: [place] [facility];              Service: Cardiovascular;  Laterality: N/A;   CARDIAC CATHETERIZATION                         N/A 09/11/2014       Comment:Procedure: Coronary Stent Intervention;  Surgeon: Isaias Cowman, MD;  Location:               [place] [facility];  Service:  Cardiovascular;              Laterality: N/A;   CORONARY ANGIOPLASTY                                          NASAL SINUS SURGERY                                           COLONOSCOPY                                                   TONSILLECTOMY                                                 Reproductive/Obstetrics                             Anesthesia Physical Anesthesia Plan  ASA: III  Anesthesia Plan: General   Post-op Pain Management:    Induction:   Airway Management Planned:   Additional Equipment:   Intra-op Plan:   Post-operative Plan:   Informed Consent: I have reviewed the patients History and Physical, chart, labs and discussed the procedure including the risks, benefits and alternatives for the proposed anesthesia with the patient or authorized representative who has indicated his/her understanding and acceptance.   Dental Advisory Given  Plan Discussed with: CRNA  Anesthesia Plan Comments:         Anesthesia Quick Evaluation

## 2015-09-30 ENCOUNTER — Encounter: Payer: Self-pay | Admitting: Unknown Physician Specialty

## 2018-10-20 ENCOUNTER — Ambulatory Visit (INDEPENDENT_AMBULATORY_CARE_PROVIDER_SITE_OTHER): Payer: Medicare HMO

## 2018-10-20 ENCOUNTER — Ambulatory Visit
Admission: EM | Admit: 2018-10-20 | Discharge: 2018-10-20 | Disposition: A | Discharge status: Home / Self Care | Payer: Medicare HMO | Attending: Family Medicine | Admitting: Family Medicine

## 2018-10-20 ENCOUNTER — Other Ambulatory Visit: Payer: Self-pay

## 2018-10-20 ENCOUNTER — Encounter: Payer: Self-pay | Admitting: Emergency Medicine

## 2018-10-20 DIAGNOSIS — R0789 Other chest pain: Secondary | ICD-10-CM

## 2018-10-20 DIAGNOSIS — S5002XA Contusion of left elbow, initial encounter: Secondary | ICD-10-CM | POA: Diagnosis not present

## 2018-10-20 DIAGNOSIS — S20212A Contusion of left front wall of thorax, initial encounter: Secondary | ICD-10-CM

## 2018-10-20 DIAGNOSIS — W010XXA Fall on same level from slipping, tripping and stumbling without subsequent striking against object, initial encounter: Secondary | ICD-10-CM | POA: Diagnosis not present

## 2018-10-20 DIAGNOSIS — W19XXXA Unspecified fall, initial encounter: Secondary | ICD-10-CM

## 2018-10-20 NOTE — ED Triage Notes (Signed)
Patient states he woke up in the middle of the night to use the bathroom and doesn't remember exactly what happened. He is unsure if he had LOC. He is c/o left rib pain and left elbow pain. He has small skin tear to his left elbow.

## 2018-10-20 NOTE — ED Provider Notes (Signed)
[facility]    CSN: [id number] Arrival date & time: 10/20/18  1223     History   Chief Complaint Chief Complaint  Patient presents with  . Fall    APPT  . Elbow Pain  . Chest Pain    rib pain  . Altered Mental Status    possible LOC    HPI Isaac Ruiz is a 78 y.o. male.   78 yo male with a c/o left lower rib pain and left elbow area pain after falling at home this morning. States he got up to go to the bathroom and tripped in his bedroom, hitting his ribs and left elbow on a piece of furniture, then landing on carpet floor. Denies hitting his head or loss of consciousness. Denies any vomiting, vision changes, numbness/tingling, headache.    Fall Associated symptoms include chest pain.  Chest Pain Associated symptoms: altered mental status   Altered Mental Status   Past Medical History:  Diagnosis Date  . Anginal pain ([place])   . Coronary artery disease   . Diabetes mellitus without complication ([place])   . GERD (gastroesophageal reflux disease)   . Hypertension   . Lipids blood increased     Patient Active Problem List   Diagnosis Date Noted  . Chest pain with high risk for cardiac etiology 09/11/2014  . Atherosclerosis of native coronary artery of native heart with unstable angina pectoris ([place]) 09/11/2014    Past Surgical History:  Procedure Laterality Date  . BACK SURGERY    . CARDIAC CATHETERIZATION    . CARDIAC CATHETERIZATION N/A 09/11/2014   Procedure: Left Heart Cath;  Surgeon: Isaias Cowman, MD;  Location: [place] [facility];  Service: Cardiovascular;  Laterality: N/A;  . CARDIAC CATHETERIZATION N/A 09/11/2014   Procedure: Coronary Stent Intervention;  Surgeon: Isaias Cowman, MD;  Location: [place] [facility];  Service: Cardiovascular;  Laterality: N/A;  . COLONOSCOPY    . COLONOSCOPY WITH PROPOFOL N/A 09/29/2015   Procedure: COLONOSCOPY WITH PROPOFOL;  Surgeon: Manya Silvas, MD;  Location: [facility];  Service:  Endoscopy;  Laterality: N/A;  . CORONARY ANGIOPLASTY    . NASAL SINUS SURGERY    . TONSILLECTOMY         Home Medications    Prior to Admission medications   Medication Sig Start Date End Date Taking? Authorizing Provider  aspirin 81 MG chewable tablet Chew by mouth daily.   Yes [provider]  aspirin EC 325 MG EC tablet Take 1 tablet (325 mg total) by mouth daily. 09/12/14  Yes Paraschos, Alexander, MD  clopidogrel (PLAVIX) 75 MG tablet Take 1 tablet (75 mg total) by mouth daily with breakfast. 09/12/14  Yes Paraschos, Alexander, MD  empagliflozin (JARDIANCE) 10 MG TABS tablet Take 10 mg by mouth daily.   Yes [provider]  famotidine (PEPCID) 20 MG tablet Take 20 mg by mouth 2 (two) times daily.   Yes [provider]  glipiZIDE (GLUCOTROL) 10 MG tablet Take 20 mg by mouth daily before breakfast.   Yes [provider]  lisinopril (PRINIVIL,ZESTRIL) 40 MG tablet Take 40 mg by mouth daily.   Yes [provider]  isosorbide mononitrate (IMDUR) 30 MG 24 hr tablet Take 30 mg by mouth daily.    [provider]    Family History History reviewed. No pertinent family history.  Social History Social History   Tobacco Use  . Smoking status: Never Smoker  . Smokeless tobacco: Never Used  Substance Use  Topics  . Alcohol use: No  . Drug use: No     Allergies   Atenolol, Cardura [doxazosin], Fenofibrate, Glucophage [metformin hcl], Hydrochlorothiazide, Lipitor [atorvastatin], Metoprolol, Pioglitazone, Pravastatin, Rosuvastatin, Vitamin d analogs, and Zetia [ezetimibe]   Review of Systems Review of Systems  Cardiovascular: Positive for chest pain.     Physical Exam Triage Vital Signs ED Triage Vitals  Enc Vitals Group     BP 10/20/18 1239 (!) 154/65     Pulse Rate 10/20/18 1239 (!) 55     Resp 10/20/18 1239 18     Temp 10/20/18 1239 98.1 F (36.7 C)     Temp Source 10/20/18 1239 Oral     SpO2 10/20/18 1239 97 %      Weight 10/20/18 1239 162 lb (73.5 kg)     Height 10/20/18 1239 5\' 9"  (1.753 m)     Head Circumference --      Peak Flow --      Pain Score 10/20/18 1238 6     Pain Loc --      Pain Edu? --      Excl. in [place]? --    No data found.  Updated Vital Signs BP (!) 154/65 (BP Location: Right Arm)   Pulse (!) 55   Temp 98.1 F (36.7 C) (Oral)   Resp 18   Ht 5\' 9"  (1.753 m)   Wt 73.5 kg   SpO2 97%   BMI 23.92 kg/m   Visual Acuity Right Eye Distance:   Left Eye Distance:   Bilateral Distance:    Right Eye Near:   Left Eye Near:    Bilateral Near:     Physical Exam Vitals signs reviewed.  Constitutional:      General: He is not in acute distress.    Appearance: He is not toxic-appearing or diaphoretic.  HENT:     Head: Normocephalic and atraumatic.     Right Ear: Tympanic membrane, ear canal and external ear normal.     Left Ear: Tympanic membrane, ear canal and external ear normal.  Eyes:     Extraocular Movements: Extraocular movements intact.     Pupils: Pupils are equal, round, and reactive to light.  Neck:     Musculoskeletal: Normal range of motion and neck supple. No neck rigidity or muscular tenderness.  Pulmonary:     Effort: Pulmonary effort is normal. No respiratory distress.     Breath sounds: No stridor. No wheezing.  Chest:     Chest wall: Tenderness (to left lower ribs; with mild superficial skin abrasion noted) present. No mass or lacerations.  Musculoskeletal:     Left elbow: He exhibits swelling (mild, swelling and ecchymosis noted to the soft tissues around the elbow joint; no bony tenderness). He exhibits normal range of motion, no effusion, no deformity and no laceration. No tenderness found.  Neurological:     General: No focal deficit present.     Mental Status: He is alert and oriented to person, place, and time.     Cranial Nerves: No cranial nerve deficit.     Sensory: No sensory deficit.     Motor: No weakness.     Coordination: Coordination  normal.     Gait: Gait normal.     Deep Tendon Reflexes: Reflexes normal.      UC Treatments / Results  Labs (all labs ordered are listed, but only abnormal results are displayed) Labs Reviewed - No data to display  EKG None  Radiology Dg  Ribs Unilateral W/chest Left  Result Date: 10/20/2018 CLINICAL DATA:  Fall with rib pain EXAM: LEFT RIBS AND CHEST - 3+ VIEW COMPARISON:  None. FINDINGS: No fracture or other bone lesions are seen involving the ribs. There is no evidence of pneumothorax or pleural effusion. Both lungs are clear. Heart size and mediastinal contours are within normal limits. IMPRESSION: Negative. Electronically Signed   By: Ulyses Jarred M.D.   On: 10/20/2018 13:17    Procedures Procedures (including critical care time)  Medications Ordered in UC Medications - No data to display  Initial Impression / Assessment and Plan / UC Course  I have reviewed the triage vital signs and the nursing notes.  Pertinent labs & imaging results that were available during my care of the patient were reviewed by me and considered in my medical decision making (see chart for details).      Final Clinical Impressions(s) / UC Diagnoses   Final diagnoses:  Contusion of rib on left side, initial encounter  Contusion of left elbow, initial encounter  Fall, initial encounter     Discharge Instructions     Rest, ice, tylenol/ibuprofen as needed    ED Prescriptions    None     1. x-ray results and diagnosis reviewed with patient 2. Recommend supportive treatment as above 3. Follow-up prn if symptoms worsen or don't improve  Controlled Substance Prescriptions [place] Controlled Substance Registry consulted? Not Applicable   Norval Gable, MD 10/20/18 [phone]

## 2018-10-20 NOTE — Discharge Instructions (Addendum)
Rest, ice, tylenol/ibuprofen as needed

## 2019-01-24 ENCOUNTER — Encounter: Payer: Self-pay | Admitting: *Deleted

## 2019-01-24 ENCOUNTER — Other Ambulatory Visit: Payer: Self-pay

## 2019-01-25 NOTE — Discharge Instructions (Signed)

## 2019-01-26 ENCOUNTER — Other Ambulatory Visit: Payer: Self-pay

## 2019-01-26 ENCOUNTER — Other Ambulatory Visit
Admission: RE | Admit: 2019-01-26 | Discharge: 2019-01-26 | Disposition: A | Discharge status: Home / Self Care | Payer: Medicare HMO | Source: Ambulatory Visit | Attending: Ophthalmology | Admitting: Ophthalmology

## 2019-01-26 DIAGNOSIS — H2512 Age-related nuclear cataract, left eye: Secondary | ICD-10-CM | POA: Diagnosis not present

## 2019-01-26 DIAGNOSIS — Z01812 Encounter for preprocedural laboratory examination: Secondary | ICD-10-CM | POA: Diagnosis present

## 2019-01-26 DIAGNOSIS — Z20828 Contact with and (suspected) exposure to other viral communicable diseases: Secondary | ICD-10-CM | POA: Insufficient documentation

## 2019-01-26 LAB — SARS CORONAVIRUS 2 (TAT 6-24 HRS): SARS Coronavirus 2: NEGATIVE

## 2019-01-29 NOTE — Anesthesia Preprocedure Evaluation (Addendum)
Anesthesia Evaluation  Patient identified by MRN, date of birth, ID band Patient awake    Reviewed: Allergy & Precautions, NPO status , Patient's Chart, lab work & pertinent test results  History of Anesthesia Complications (+) PONV and history of anesthetic complications  Airway Mallampati: IV   Neck ROM: Full    Dental  (+)    Pulmonary neg pulmonary ROS,    Pulmonary exam normal breath sounds clear to auscultation       Cardiovascular hypertension, + CAD (s/p stents on Plavix, last dose in AM 01/31/19)  Normal cardiovascular exam Rhythm:Regular Rate:Normal     Neuro/Psych negative neurological ROS     GI/Hepatic GERD  Controlled,  Endo/Other  diabetes, Type 2  Renal/GU negative Renal ROS     Musculoskeletal   Abdominal   Peds  Hematology negative hematology ROS (+)   Anesthesia Other Findings   Reproductive/Obstetrics                            Anesthesia Physical Anesthesia Plan  ASA: II  Anesthesia Plan: MAC   Post-op Pain Management:    Induction: Intravenous  PONV Risk Score and Plan: 2 and TIVA and Midazolam  Airway Management Planned: Natural Airway  Additional Equipment:   Intra-op Plan:   Post-operative Plan:   Informed Consent: I have reviewed the patients History and Physical, chart, labs and discussed the procedure including the risks, benefits and alternatives for the proposed anesthesia with the patient or authorized representative who has indicated his/her understanding and acceptance.       Plan Discussed with: CRNA  Anesthesia Plan Comments:        Anesthesia Quick Evaluation

## 2019-01-31 ENCOUNTER — Encounter
Admission: RE | Disposition: A | Discharge status: Home / Self Care | Payer: Self-pay | Source: Home / Self Care | Attending: Ophthalmology

## 2019-01-31 ENCOUNTER — Ambulatory Visit: Payer: Medicare HMO | Admitting: Anesthesiology

## 2019-01-31 ENCOUNTER — Other Ambulatory Visit: Payer: Self-pay

## 2019-01-31 ENCOUNTER — Ambulatory Visit
Admission: RE | Admit: 2019-01-31 | Discharge: 2019-01-31 | Disposition: A | Discharge status: Home / Self Care | Payer: Medicare HMO | Attending: Ophthalmology | Admitting: Ophthalmology

## 2019-01-31 DIAGNOSIS — H9190 Unspecified hearing loss, unspecified ear: Secondary | ICD-10-CM | POA: Diagnosis not present

## 2019-01-31 DIAGNOSIS — I10 Essential (primary) hypertension: Secondary | ICD-10-CM | POA: Diagnosis not present

## 2019-01-31 DIAGNOSIS — K5790 Diverticulosis of intestine, part unspecified, without perforation or abscess without bleeding: Secondary | ICD-10-CM | POA: Diagnosis not present

## 2019-01-31 DIAGNOSIS — Z888 Allergy status to other drugs, medicaments and biological substances status: Secondary | ICD-10-CM | POA: Diagnosis not present

## 2019-01-31 DIAGNOSIS — E7800 Pure hypercholesterolemia, unspecified: Secondary | ICD-10-CM | POA: Diagnosis not present

## 2019-01-31 DIAGNOSIS — E118 Type 2 diabetes mellitus with unspecified complications: Secondary | ICD-10-CM | POA: Insufficient documentation

## 2019-01-31 DIAGNOSIS — N289 Disorder of kidney and ureter, unspecified: Secondary | ICD-10-CM | POA: Diagnosis not present

## 2019-01-31 DIAGNOSIS — Z955 Presence of coronary angioplasty implant and graft: Secondary | ICD-10-CM | POA: Diagnosis not present

## 2019-01-31 DIAGNOSIS — M1990 Unspecified osteoarthritis, unspecified site: Secondary | ICD-10-CM | POA: Insufficient documentation

## 2019-01-31 DIAGNOSIS — K219 Gastro-esophageal reflux disease without esophagitis: Secondary | ICD-10-CM | POA: Insufficient documentation

## 2019-01-31 DIAGNOSIS — I2510 Atherosclerotic heart disease of native coronary artery without angina pectoris: Secondary | ICD-10-CM | POA: Diagnosis not present

## 2019-01-31 DIAGNOSIS — E1136 Type 2 diabetes mellitus with diabetic cataract: Secondary | ICD-10-CM | POA: Insufficient documentation

## 2019-01-31 DIAGNOSIS — H2512 Age-related nuclear cataract, left eye: Secondary | ICD-10-CM | POA: Diagnosis present

## 2019-01-31 HISTORY — DX: Presence of dental prosthetic device (complete) (partial): Z97.2

## 2019-01-31 HISTORY — DX: Other specified postprocedural states: Z98.890

## 2019-01-31 HISTORY — DX: Motion sickness, initial encounter: T75.3XXA

## 2019-01-31 HISTORY — DX: Nausea with vomiting, unspecified: R11.2

## 2019-01-31 HISTORY — PX: CATARACT EXTRACTION W/PHACO: SHX586

## 2019-01-31 HISTORY — DX: Other complications of anesthesia, initial encounter: T88.59XA

## 2019-01-31 LAB — GLUCOSE, CAPILLARY
Glucose-Capillary: 128 mg/dL — ABNORMAL HIGH (ref 70–99)
Glucose-Capillary: 126 mg/dL — ABNORMAL HIGH (ref 70–99)

## 2019-01-31 SURGERY — PHACOEMULSIFICATION, CATARACT, WITH IOL INSERTION
Anesthesia: Monitor Anesthesia Care | Site: Eye | Laterality: Left

## 2019-01-31 MED ORDER — ACETAMINOPHEN 325 MG PO TABS: 650.0000 mg | ORAL_TABLET | Freq: Once | ORAL | Status: DC | PRN

## 2019-01-31 MED ORDER — CEFUROXIME OPHTHALMIC INJECTION 1 MG/0.1 ML
INJECTION | OPHTHALMIC | Status: DC | PRN
  Administered 2019-01-31: 0.1 mL via INTRACAMERAL

## 2019-01-31 MED ORDER — MOXIFLOXACIN HCL 0.5 % OP SOLN
1.0000 [drp] | OPHTHALMIC | Status: DC | PRN
  Administered 2019-01-31 (×3): 1 [drp] via OPHTHALMIC

## 2019-01-31 MED ORDER — LIDOCAINE HCL (PF) 2 % IJ SOLN
INTRAOCULAR | Status: DC | PRN
  Administered 2019-01-31: 1 mL

## 2019-01-31 MED ORDER — LACTATED RINGERS IV SOLN: 100.0000 mL/h | INTRAVENOUS | Status: DC

## 2019-01-31 MED ORDER — ONDANSETRON HCL 4 MG/2ML IJ SOLN: 4.0000 mg | Freq: Once | INTRAMUSCULAR | Status: DC | PRN

## 2019-01-31 MED ORDER — TETRACAINE HCL 0.5 % OP SOLN
1.0000 [drp] | OPHTHALMIC | Status: DC | PRN
  Administered 2019-01-31 (×3): 1 [drp] via OPHTHALMIC

## 2019-01-31 MED ORDER — ACETAMINOPHEN 160 MG/5ML PO SOLN: 325.0000 mg | ORAL | Status: DC | PRN

## 2019-01-31 MED ORDER — EPINEPHRINE PF 1 MG/ML IJ SOLN
INTRAOCULAR | Status: DC | PRN
  Administered 2019-01-31: 85 mL via OPHTHALMIC

## 2019-01-31 MED ORDER — MIDAZOLAM HCL 2 MG/2ML IJ SOLN
INTRAMUSCULAR | Status: DC | PRN
  Administered 2019-01-31: 2 mg via INTRAVENOUS

## 2019-01-31 MED ORDER — BRIMONIDINE TARTRATE-TIMOLOL 0.2-0.5 % OP SOLN
OPHTHALMIC | Status: DC | PRN
  Administered 2019-01-31: 1 [drp] via OPHTHALMIC

## 2019-01-31 MED ORDER — ARMC OPHTHALMIC DILATING DROPS
1.0000 "application " | OPHTHALMIC | Status: DC | PRN
  Administered 2019-01-31 (×3): 1 via OPHTHALMIC

## 2019-01-31 MED ORDER — NA HYALUR & NA CHOND-NA HYALUR 0.4-0.35 ML IO KIT
PACK | INTRAOCULAR | Status: DC | PRN
  Administered 2019-01-31: 1 mL via INTRAOCULAR

## 2019-01-31 MED ORDER — FENTANYL CITRATE (PF) 100 MCG/2ML IJ SOLN
INTRAMUSCULAR | Status: DC | PRN
  Administered 2019-01-31 (×2): 50 ug via INTRAVENOUS

## 2019-01-31 SURGICAL SUPPLY — 16 items
CANNULA ANT/CHMB 27G (MISCELLANEOUS) ×1 IMPLANT
CANNULA ANT/CHMB 27GA (MISCELLANEOUS) ×2 IMPLANT
GLOVE SURG LX 7.5 STRW (GLOVE) ×1
GLOVE SURG LX STRL 7.5 STRW (GLOVE) ×1 IMPLANT
GLOVE SURG TRIUMPH 8.0 PF LTX (GLOVE) ×2 IMPLANT
GOWN STRL REUS W/ TWL LRG LVL3 (GOWN DISPOSABLE) ×2 IMPLANT
GOWN STRL REUS W/TWL LRG LVL3 (GOWN DISPOSABLE) ×2
LENS IOL TECNIS ITEC 20.0 (Intraocular Lens) ×1 IMPLANT
MARKER SKIN DUAL TIP RULER LAB (MISCELLANEOUS) ×2 IMPLANT
PACK CATARACT BRASINGTON (MISCELLANEOUS) ×2 IMPLANT
PACK EYE AFTER SURG (MISCELLANEOUS) ×2 IMPLANT
PACK OPTHALMIC (MISCELLANEOUS) ×2 IMPLANT
SYR 3ML LL SCALE MARK (SYRINGE) ×2 IMPLANT
SYR TB 1ML LUER SLIP (SYRINGE) ×2 IMPLANT
WATER STERILE IRR 500ML POUR (IV SOLUTION) ×2 IMPLANT
WIPE NON LINTING 3.25X3.25 (MISCELLANEOUS) ×2 IMPLANT

## 2019-01-31 NOTE — H&P (Signed)

## 2019-01-31 NOTE — Transfer of Care (Signed)
Immediate Anesthesia Transfer of Care Note  Patient: Isaac Ruiz  Procedure(s) Performed: CATARACT EXTRACTION PHACO AND INTRAOCULAR LENS PLACEMENT (IOC) LEFT DIABETIC  01:27.0  16.4%  14.29 (Left Eye)  Patient Location: PACU  Anesthesia Type: MAC  Level of Consciousness: awake, alert  and patient cooperative  Airway and Oxygen Therapy: Patient Spontanous Breathing and Patient connected to supplemental oxygen  Post-op Assessment: Post-op Vital signs reviewed, Patient's Cardiovascular Status Stable, Respiratory Function Stable, Patent Airway and No signs of Nausea or vomiting  Post-op Vital Signs: Reviewed and stable  Complications: No apparent anesthesia complications

## 2019-01-31 NOTE — Op Note (Signed)
OPERATIVE NOTE  JAQUES NADEL [id number] 01/31/2019   PREOPERATIVE DIAGNOSIS:  Nuclear sclerotic cataract left eye. H25.12   POSTOPERATIVE DIAGNOSIS:    Nuclear sclerotic cataract left eye.     PROCEDURE:  Phacoemusification with posterior chamber intraocular lens placement of the left eye  Ultrasound time: Procedure(s) with comments: CATARACT EXTRACTION PHACO AND INTRAOCULAR LENS PLACEMENT (IOC) LEFT DIABETIC  01:27.0  16.4%  14.29 (Left) - Diabetic - oral meds  LENS:   Implant Name Type Inv. Item Serial No. Manufacturer Lot No. LRB No. Used Action  LENS IOL DIOP 20.0 - [id number] Intraocular Lens LENS IOL DIOP 20.0 [id number]  Left 1 Implanted      SURGEON:  Wyonia Hough, MD   ANESTHESIA:  Topical with tetracaine drops and 2% Xylocaine jelly, augmented with 1% preservative-free intracameral lidocaine.    COMPLICATIONS:  None.   DESCRIPTION OF PROCEDURE:  The patient was identified in the holding room and transported to the operating room and placed in the supine position under the operating microscope.  The left eye was identified as the operative eye and it was prepped and draped in the usual sterile ophthalmic fashion.   A 1 millimeter clear-corneal paracentesis was made at the 1:30 position.  0.5 ml of preservative-free 1% lidocaine was injected into the anterior chamber.  The anterior chamber was filled with Viscoat viscoelastic.  A 2.4 millimeter keratome was used to make a near-clear corneal incision at the 10:30 position.  .  A curvilinear capsulorrhexis was made with a cystotome and capsulorrhexis forceps.  Balanced salt solution was used to hydrodissect and hydrodelineate the nucleus.   Phacoemulsification was then used in stop and chop fashion to remove the lens nucleus and epinucleus.  The remaining cortex was then removed using the irrigation and aspiration handpiece. Provisc was then placed into the capsular bag to distend it for lens placement.  A lens  was then injected into the capsular bag.  The remaining viscoelastic was aspirated.   Wounds were hydrated with balanced salt solution.  The anterior chamber was inflated to a physiologic pressure with balanced salt solution.  No wound leaks were noted. Cefuroxime 0.1 ml of a 10mg /ml solution was injected into the anterior chamber for a dose of 1 mg of intracameral antibiotic at the completion of the case.   Timolol and Brimonidine drops were applied to the eye.  The patient was taken to the recovery room in stable condition without complications of anesthesia or surgery.  Caydence Enck 01/31/2019, 8:26 AM

## 2019-01-31 NOTE — Anesthesia Postprocedure Evaluation (Signed)
Anesthesia Post Note  Patient: Isaac Ruiz  Procedure(s) Performed: CATARACT EXTRACTION PHACO AND INTRAOCULAR LENS PLACEMENT (IOC) LEFT DIABETIC  01:27.0  16.4%  14.29 (Left Eye)  Patient location during evaluation: PACU Anesthesia Type: MAC Level of consciousness: awake and alert, oriented and patient cooperative Pain management: pain level controlled Vital Signs Assessment: post-procedure vital signs reviewed and stable Respiratory status: spontaneous breathing, nonlabored ventilation and respiratory function stable Cardiovascular status: blood pressure returned to baseline and stable Postop Assessment: adequate PO intake Anesthetic complications: no    Darrin Nipper

## 2019-02-21 ENCOUNTER — Encounter: Payer: Self-pay | Admitting: *Deleted

## 2019-02-21 ENCOUNTER — Other Ambulatory Visit: Payer: Self-pay

## 2019-02-22 NOTE — Anesthesia Preprocedure Evaluation (Addendum)
Anesthesia Evaluation  Patient identified by MRN, date of birth, ID band Patient awake    Reviewed: Allergy & Precautions, NPO status , Patient's Chart, lab work & pertinent test results  History of Anesthesia Complications (+) PONV and history of anesthetic complications  Airway Mallampati: II   Neck ROM: Full    Dental  (+) Partial Lower,    Pulmonary    Pulmonary exam normal breath sounds clear to auscultation       Cardiovascular hypertension, (-) angina+ CAD and + Cardiac Stents  (-) DOE Normal cardiovascular exam Rhythm:Regular Rate:Normal   HLD  LHC (2016): 50% stenosis mid LAD, 70% stenosis distal LAD, 70% stenosis mid RCA, and high-grade 95% stenosis in the proximal segment left circumflex S/p percutaneous coronary intervention, receiving a 3.0 x 15 mm Xience drug-eluting stent in the proximal segment left circumflex with excellent angiographic result.  BMS D1 and PDA 06/28/2000   Neuro/Psych    GI/Hepatic GERD  Controlled,  Endo/Other  diabetes, Type 2  Renal/GU      Musculoskeletal   Abdominal   Peds  Hematology   Anesthesia Other Findings   Reproductive/Obstetrics                            Anesthesia Physical  Anesthesia Plan  ASA: III  Anesthesia Plan: MAC   Post-op Pain Management:    Induction: Intravenous  PONV Risk Score and Plan: 2 and Midazolam and Treatment may vary due to age or medical condition  Airway Management Planned: Natural Airway  Additional Equipment:   Intra-op Plan:   Post-operative Plan:   Informed Consent: I have reviewed the patients History and Physical, chart, labs and discussed the procedure including the risks, benefits and alternatives for the proposed anesthesia with the patient or authorized representative who has indicated his/her understanding and acceptance.       Plan Discussed with: CRNA  Anesthesia Plan Comments:          Anesthesia Quick Evaluation

## 2019-02-23 ENCOUNTER — Other Ambulatory Visit: Payer: Self-pay

## 2019-02-23 ENCOUNTER — Other Ambulatory Visit
Admission: RE | Admit: 2019-02-23 | Discharge: 2019-02-23 | Disposition: A | Discharge status: Home / Self Care | Payer: Medicare HMO | Source: Ambulatory Visit | Attending: Ophthalmology | Admitting: Ophthalmology

## 2019-02-23 DIAGNOSIS — Z20828 Contact with and (suspected) exposure to other viral communicable diseases: Secondary | ICD-10-CM | POA: Diagnosis not present

## 2019-02-23 DIAGNOSIS — Z01812 Encounter for preprocedural laboratory examination: Secondary | ICD-10-CM | POA: Insufficient documentation

## 2019-02-23 LAB — SARS CORONAVIRUS 2 (TAT 6-24 HRS): SARS Coronavirus 2: NEGATIVE

## 2019-02-26 NOTE — Discharge Instructions (Signed)

## 2019-02-28 ENCOUNTER — Ambulatory Visit
Admission: RE | Admit: 2019-02-28 | Discharge: 2019-02-28 | Disposition: A | Discharge status: Home / Self Care | Payer: Medicare HMO | Attending: Ophthalmology | Admitting: Ophthalmology

## 2019-02-28 ENCOUNTER — Ambulatory Visit: Payer: Medicare HMO | Admitting: Anesthesiology

## 2019-02-28 ENCOUNTER — Encounter
Admission: RE | Disposition: A | Discharge status: Home / Self Care | Payer: Self-pay | Source: Home / Self Care | Attending: Ophthalmology

## 2019-02-28 DIAGNOSIS — I25110 Atherosclerotic heart disease of native coronary artery with unstable angina pectoris: Secondary | ICD-10-CM | POA: Insufficient documentation

## 2019-02-28 DIAGNOSIS — Z888 Allergy status to other drugs, medicaments and biological substances status: Secondary | ICD-10-CM | POA: Insufficient documentation

## 2019-02-28 DIAGNOSIS — Z881 Allergy status to other antibiotic agents status: Secondary | ICD-10-CM | POA: Diagnosis not present

## 2019-02-28 DIAGNOSIS — Z955 Presence of coronary angioplasty implant and graft: Secondary | ICD-10-CM | POA: Insufficient documentation

## 2019-02-28 DIAGNOSIS — H2511 Age-related nuclear cataract, right eye: Secondary | ICD-10-CM | POA: Insufficient documentation

## 2019-02-28 DIAGNOSIS — I10 Essential (primary) hypertension: Secondary | ICD-10-CM | POA: Diagnosis not present

## 2019-02-28 DIAGNOSIS — E1136 Type 2 diabetes mellitus with diabetic cataract: Secondary | ICD-10-CM | POA: Diagnosis not present

## 2019-02-28 DIAGNOSIS — Z9849 Cataract extraction status, unspecified eye: Secondary | ICD-10-CM | POA: Insufficient documentation

## 2019-02-28 DIAGNOSIS — M1990 Unspecified osteoarthritis, unspecified site: Secondary | ICD-10-CM | POA: Insufficient documentation

## 2019-02-28 HISTORY — PX: CATARACT EXTRACTION W/PHACO: SHX586

## 2019-02-28 LAB — GLUCOSE, CAPILLARY
Glucose-Capillary: 126 mg/dL — ABNORMAL HIGH (ref 70–99)
Glucose-Capillary: 138 mg/dL — ABNORMAL HIGH (ref 70–99)

## 2019-02-28 SURGERY — PHACOEMULSIFICATION, CATARACT, WITH IOL INSERTION
Anesthesia: Monitor Anesthesia Care | Site: Eye | Laterality: Right

## 2019-02-28 MED ORDER — LACTATED RINGERS IV SOLN: 100.0000 mL/h | INTRAVENOUS | Status: DC

## 2019-02-28 MED ORDER — FENTANYL CITRATE (PF) 100 MCG/2ML IJ SOLN
INTRAMUSCULAR | Status: DC | PRN
  Administered 2019-02-28 (×2): 50 ug via INTRAVENOUS

## 2019-02-28 MED ORDER — ARMC OPHTHALMIC DILATING DROPS
1.0000 "application " | OPHTHALMIC | Status: DC | PRN
  Administered 2019-02-28 (×3): 1 via OPHTHALMIC

## 2019-02-28 MED ORDER — TETRACAINE HCL 0.5 % OP SOLN
1.0000 [drp] | OPHTHALMIC | Status: DC | PRN
  Administered 2019-02-28 (×3): 1 [drp] via OPHTHALMIC

## 2019-02-28 MED ORDER — LIDOCAINE HCL (PF) 2 % IJ SOLN
INTRAOCULAR | Status: DC | PRN
  Administered 2019-02-28: 1 mL

## 2019-02-28 MED ORDER — MOXIFLOXACIN HCL 0.5 % OP SOLN
1.0000 [drp] | OPHTHALMIC | Status: DC | PRN
  Administered 2019-02-28 (×3): 1 [drp] via OPHTHALMIC

## 2019-02-28 MED ORDER — CEFUROXIME OPHTHALMIC INJECTION 1 MG/0.1 ML
INJECTION | OPHTHALMIC | Status: DC | PRN
  Administered 2019-02-28: 0.1 mL via INTRACAMERAL

## 2019-02-28 MED ORDER — BRIMONIDINE TARTRATE-TIMOLOL 0.2-0.5 % OP SOLN
OPHTHALMIC | Status: DC | PRN
  Administered 2019-02-28: 1 [drp] via OPHTHALMIC

## 2019-02-28 MED ORDER — MIDAZOLAM HCL 2 MG/2ML IJ SOLN
INTRAMUSCULAR | Status: DC | PRN
  Administered 2019-02-28: 2 mg via INTRAVENOUS

## 2019-02-28 MED ORDER — NA HYALUR & NA CHOND-NA HYALUR 0.4-0.35 ML IO KIT
PACK | INTRAOCULAR | Status: DC | PRN
  Administered 2019-02-28: 1 mL via INTRAOCULAR

## 2019-02-28 SURGICAL SUPPLY — 16 items
CANNULA ANT/CHMB 27G (MISCELLANEOUS) ×1 IMPLANT
CANNULA ANT/CHMB 27GA (MISCELLANEOUS) ×3 IMPLANT
GLOVE SURG LX 7.5 STRW (GLOVE) ×2
GLOVE SURG LX STRL 7.5 STRW (GLOVE) ×1 IMPLANT
GLOVE SURG TRIUMPH 8.0 PF LTX (GLOVE) ×3 IMPLANT
GOWN STRL REUS W/ TWL LRG LVL3 (GOWN DISPOSABLE) ×2 IMPLANT
GOWN STRL REUS W/TWL LRG LVL3 (GOWN DISPOSABLE) ×4
LENS IOL TECNIS ITEC 20.0 (Intraocular Lens) ×2 IMPLANT
MARKER SKIN DUAL TIP RULER LAB (MISCELLANEOUS) ×3 IMPLANT
PACK CATARACT BRASINGTON (MISCELLANEOUS) ×3 IMPLANT
PACK EYE AFTER SURG (MISCELLANEOUS) ×3 IMPLANT
PACK OPTHALMIC (MISCELLANEOUS) ×3 IMPLANT
SYR 3ML LL SCALE MARK (SYRINGE) ×3 IMPLANT
SYR TB 1ML LUER SLIP (SYRINGE) ×3 IMPLANT
WATER STERILE IRR 500ML POUR (IV SOLUTION) ×3 IMPLANT
WIPE NON LINTING 3.25X3.25 (MISCELLANEOUS) ×3 IMPLANT

## 2019-02-28 NOTE — Anesthesia Postprocedure Evaluation (Signed)
Anesthesia Post Note  Patient: Isaac Ruiz  Procedure(s) Performed: CATARACT EXTRACTION PHACO AND INTRAOCULAR LENS PLACEMENT (IOC) RIGHT DIABETIC 00:58.5  16.5%  9.66 (Right Eye)  Patient location during evaluation: PACU Anesthesia Type: MAC Level of consciousness: awake and alert Pain management: pain level controlled Vital Signs Assessment: post-procedure vital signs reviewed and stable Respiratory status: spontaneous breathing, nonlabored ventilation, respiratory function stable and patient connected to nasal cannula oxygen Cardiovascular status: stable and blood pressure returned to baseline Postop Assessment: no apparent nausea or vomiting Anesthetic complications: no    Tracen Mahler A  Shellee Streng

## 2019-02-28 NOTE — Anesthesia Procedure Notes (Signed)
Procedure Name: MAC Performed by: Camarie Mctigue M, CRNA Pre-anesthesia Checklist: Timeout performed, Patient being monitored, Suction available, Emergency Drugs available and Patient identified Patient Re-evaluated:Patient Re-evaluated prior to induction Oxygen Delivery Method: Nasal cannula       

## 2019-02-28 NOTE — H&P (Signed)

## 2019-02-28 NOTE — Transfer of Care (Signed)
Immediate Anesthesia Transfer of Care Note  Patient: Isaac Ruiz  Procedure(s) Performed: CATARACT EXTRACTION PHACO AND INTRAOCULAR LENS PLACEMENT (IOC) RIGHT DIABETIC 00:58.5  16.5%  9.66 (Right Eye)  Patient Location: PACU  Anesthesia Type: MAC  Level of Consciousness: awake, alert  and patient cooperative  Airway and Oxygen Therapy: Patient Spontanous Breathing and Patient connected to supplemental oxygen  Post-op Assessment: Post-op Vital signs reviewed, Patient's Cardiovascular Status Stable, Respiratory Function Stable, Patent Airway and No signs of Nausea or vomiting  Post-op Vital Signs: Reviewed and stable  Complications: No apparent anesthesia complications

## 2019-02-28 NOTE — Op Note (Signed)
LOCATION:  [facility]   PREOPERATIVE DIAGNOSIS:    Nuclear sclerotic cataract right eye. H25.11   POSTOPERATIVE DIAGNOSIS:  Nuclear sclerotic cataract right eye.     PROCEDURE:  Phacoemusification with posterior chamber intraocular lens placement of the right eye   ULTRASOUND TIME: Procedure(s) with comments: CATARACT EXTRACTION PHACO AND INTRAOCULAR LENS PLACEMENT (IOC) RIGHT DIABETIC 00:58.5  16.5%  9.66 (Right) - Diabetic - oral meds  LENS:   Implant Name Type Inv. Item Serial No. Manufacturer Lot No. LRB No. Used Action  LENS IOL DIOP 20.0 - [id number] Intraocular Lens LENS IOL DIOP 20.0 [id number]  Right 1 Implanted         SURGEON:  Wyonia Hough, MD   ANESTHESIA:  Topical with tetracaine drops and 2% Xylocaine jelly, augmented with 1% preservative-free intracameral lidocaine.    COMPLICATIONS:  None.   DESCRIPTION OF PROCEDURE:  The patient was identified in the holding room and transported to the operating room and placed in the supine position under the operating microscope.  The right eye was identified as the operative eye and it was prepped and draped in the usual sterile ophthalmic fashion.   A 1 millimeter clear-corneal paracentesis was made at the 12:00 position.  0.5 ml of preservative-free 1% lidocaine was injected into the anterior chamber. The anterior chamber was filled with Viscoat viscoelastic.  A 2.4 millimeter keratome was used to make a near-clear corneal incision at the 9:00 position.  A curvilinear capsulorrhexis was made with a cystotome and capsulorrhexis forceps.  Balanced salt solution was used to hydrodissect and hydrodelineate the nucleus.   Phacoemulsification was then used in stop and chop fashion to remove the lens nucleus and epinucleus.  The remaining cortex was then removed using the irrigation and aspiration handpiece. Provisc was then placed into the capsular bag to distend it for lens placement.  A lens was then injected  into the capsular bag.  The remaining viscoelastic was aspirated.   Wounds were hydrated with balanced salt solution.  The anterior chamber was inflated to a physiologic pressure with balanced salt solution.  No wound leaks were noted. Cefuroxime 0.1 ml of a 10mg /ml solution was injected into the anterior chamber for a dose of 1 mg of intracameral antibiotic at the completion of the case.   Timolol and Brimonidine drops were applied to the eye.  The patient was taken to the recovery room in stable condition without complications of anesthesia or surgery.   Navina Wohlers 02/28/2019, 7:56 AM

## 2019-03-01 ENCOUNTER — Encounter: Payer: Self-pay | Admitting: Ophthalmology

## 2019-12-25 ENCOUNTER — Other Ambulatory Visit: Payer: Self-pay | Admitting: Family Medicine

## 2019-12-25 ENCOUNTER — Ambulatory Visit
Admission: RE | Admit: 2019-12-25 | Discharge: 2019-12-25 | Disposition: A | Discharge status: Home / Self Care | Payer: Medicare HMO | Attending: Family Medicine | Admitting: Family Medicine

## 2019-12-25 ENCOUNTER — Other Ambulatory Visit: Payer: Self-pay

## 2019-12-25 ENCOUNTER — Ambulatory Visit
Admission: RE | Admit: 2019-12-25 | Discharge: 2019-12-25 | Disposition: A | Discharge status: Home / Self Care | Payer: Medicare HMO | Source: Ambulatory Visit | Attending: Family Medicine | Admitting: Family Medicine

## 2019-12-25 DIAGNOSIS — R634 Abnormal weight loss: Secondary | ICD-10-CM

## 2020-04-01 NOTE — Progress Notes (Signed)
[facility]  [address] [place], [place] [address] Phone: [phone]  Fax: [phone]   Clinic Day:  04/02/2020  Referring physician: Gayland Curry, MD  Chief Complaint: Isaac Ruiz is a 79 y.o. male with an abnormal SPEP who is referred in consultation by Dr. Gayland Curry for assessment and management.   HPI: The patient saw Dr. Astrid Divine on 12/24/2019 for a follow-up regarding diabetes, hypertension, and hyperlipidemia. He reported weight loss over the past year; he had gone down 4 belt sizes.  Weight loss work-up revealed a hematocrit of 42.9, hemoglobin 14.8, MCV 85, platelets 180,000, WBC 5,700 (ANC 3600). Creatinine was 1.2.  LFTs were normal.  Albumen was 4.2 and calcium 9.8. CRP was 0.31 and sed rate 5.  TSH was 0.92.  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 Random UPEP was normal.  CXR on 12/25/2019 was negative for acute process.  PSA was 1.26 on 10/23/2014.  Labs followed: 09/12/2014: Hematocrit 38.3, hemoglobin 12.8, MCV 84.0, platelets 141,000, WBC 6,800. 11/06/2015: Hematocrit 45.2, hemoglobin 15.7, MCV 81.0, platelets 146,000, WBC 6,200. 08/12/2016: Hematocrit 39.6, hemoglobin 13.5, MCV 84.0, platelets 178,000, WBC 5,900. 04/12/2017: Hematocrit 39.1, hemoglobin 13.3, MCV 83.8, platelets 205,000, WBC 6,500. 05/23/2017: Hematocrit 40.6, hemoglobin 13.8, MCV 82.0, platelets 172,000, WBC 8,000. Ferritin 146. Iron saturation 14%. TIBC 339. 11/25/2017: Hematocrit 38.2, hemoglobin 13.1, MCV 85.0, platelets 164,000, WBC 6,800. Ferritin 193. Iron saturation 30%. TIBC 334. 09/19/2019: Hematocrit 44.9, hemoglobin 14.9, MCV 85.0, platelets 181,000, WBC 6,000.  Symptomatically, he has been fine. He reports sweats if his blood sugars are not normal. He wakes up twice throughout the night to urinate. He has pain in the fingers on his left hand x 1 year.  The weight loss began when he  started taking Jardiance. His weight has been followed: 172 lbs on 09/09/2014, 168 lbs on 08/08/2015, 164 lbs on 06/01/2017, 172 lbs on 03/14/2018, 162 lbs on 09/19/2018, and 163 lbs on 06/25/2019. He weighted 158 lbs today. At his heaviest, he weighed 200 lbs, but this was about 20 years ago.   The patient denies fevers, sweats, headaches, runny nose, sore throat, cough, shortness of breath, chest pain, palpitations, nausea, vomiting, diarrhea, reflux, skin changes, numbness, weakness, problems with infections, and bleeding of any kind.  His mother passed away from breast cancer in 1976.   Past Medical History:  Diagnosis Date  . Anginal pain ([place])   . Complication of anesthesia   . Coronary artery disease   . Dental bridge present    top  . Diabetes mellitus without complication ([place])   . GERD (gastroesophageal reflux disease)   . Hypertension   . Lipids blood increased   . Motion sickness    deep sea fishing  . PONV (postoperative nausea and vomiting)   . Wears dentures    partial lower    Past Surgical History:  Procedure Laterality Date  . BACK SURGERY    . CARDIAC CATHETERIZATION    . CARDIAC CATHETERIZATION N/A 09/11/2014   Procedure: Left Heart Cath;  Surgeon: Isaias Cowman, MD;  Location: [facility];  Service: Cardiovascular;  Laterality: N/A;  . CARDIAC CATHETERIZATION N/A 09/11/2014   Procedure: Coronary Stent Intervention;  Surgeon: Isaias Cowman, MD;  Location: [facility];  Service: Cardiovascular;  Laterality: N/A;  . CATARACT EXTRACTION W/PHACO Left 01/31/2019   Procedure: CATARACT EXTRACTION PHACO AND INTRAOCULAR LENS PLACEMENT (IOC) LEFT DIABETIC  01:27.0  16.4%  14.29;  Surgeon:  Leandrew Koyanagi, MD;  Location: [facility];  Service: Ophthalmology;  Laterality: Left;  Diabetic - oral meds  . CATARACT EXTRACTION W/PHACO Right 02/28/2019   Procedure: CATARACT EXTRACTION PHACO AND INTRAOCULAR LENS PLACEMENT (IOC) RIGHT DIABETIC  00:58.5  16.5%  9.66;  Surgeon: Leandrew Koyanagi, MD;  Location: [facility];  Service: Ophthalmology;  Laterality: Right;  Diabetic - oral meds  . COLONOSCOPY    . COLONOSCOPY WITH PROPOFOL N/A 09/29/2015   Procedure: COLONOSCOPY WITH PROPOFOL;  Surgeon: Manya Silvas, MD;  Location: [facility];  Service: Endoscopy;  Laterality: N/A;  . CORONARY ANGIOPLASTY    . NASAL SINUS SURGERY    . TONSILLECTOMY      History reviewed. No pertinent family history.  Social History:  reports that he has never smoked. He has never used smokeless tobacco. He reports that he does not drink alcohol and does not use drugs. He does not smoke. He has not drank any alcohol since he was diagnosed with diabetes in 1992. He used to be in [employer]. He was exposed to a "radiation room" in the [employer]. He is a retired Chief Financial Officer. His wife passed away from breast cancer in 2013. The patient is accompanied by his daughter, Tye [place], today.  Allergies:  Allergies  Allergen Reactions  . Atenolol Shortness Of Breath  . Cardura [Doxazosin] Other (See Comments)    Renal issues  . Ciprofloxacin     hypoglycemia  . Fenofibrate Itching  . Glucophage [Metformin Hcl] Other (See Comments)    flatua  . Hydrochlorothiazide Other (See Comments)  . Lipitor [Atorvastatin] Other (See Comments)    aching  . Metoprolol   . Niacin Other (See Comments)  . Other     (d)-limonene Flavor  . Pioglitazone Dermatitis  . Pravastatin Other (See Comments)    hyperglycemia  . Rosuvastatin   . Statins   . Vitamin D Analogs   . Zetia [Ezetimibe]     Current Medications: Current Outpatient Medications  Medication Sig Dispense Refill  . aspirin 81 MG chewable tablet Chew by mouth daily.    . clopidogrel (PLAVIX) 75 MG tablet Take 1 tablet (75 mg total) by mouth daily with breakfast. 30 tablet 6  . empagliflozin (JARDIANCE) 25 MG TABS tablet Take by mouth daily. Patient takes 0.5 tablet a day (12.5 mg total)    .  famotidine (PEPCID) 20 MG tablet Take 20 mg by mouth 2 (two) times daily as needed.     Marland Kitchen glipiZIDE (GLUCOTROL) 10 MG tablet Take 20 mg by mouth 2 (two) times daily before a meal.     . glucose blood (PRECISION QID TEST) test strip Use 1 each (1 strip total) 3 (three) times daily One Touch Ultra.Use as instructed.    Marland Kitchen lisinopril (PRINIVIL,ZESTRIL) 40 MG tablet Take 40 mg by mouth daily.    Glory Rosebush Delica Lancets 71I MISC 50 each by Other route once daily    . SENNA PO Take by mouth as needed.    . sildenafil (REVATIO) 20 MG tablet Take 100 mg by mouth daily as needed. (Patient not taking: Reported on 04/02/2020)     No current facility-administered medications for this visit.    Review of Systems  Constitutional: Positive for weight loss. Negative for chills, diaphoresis (if his blood sugars are abnormal), fever and malaise/fatigue.  HENT: Negative for congestion, ear discharge, ear pain, hearing loss, nosebleeds, sinus pain, sore throat and tinnitus.   Eyes: Negative for blurred vision.  Respiratory: Negative  for cough, hemoptysis, sputum production and shortness of breath.   Cardiovascular: Negative for chest pain, palpitations and leg swelling.  Gastrointestinal: Negative for abdominal pain, blood in stool, constipation, diarrhea, heartburn, melena, nausea and vomiting.  Genitourinary: Negative for dysuria, frequency, hematuria and urgency.  Musculoskeletal: Negative for back pain, joint pain, myalgias and neck pain.  Skin: Negative for itching and rash.  Neurological: Negative for dizziness, tingling, sensory change, weakness and headaches.  Endo/Heme/Allergies: Does not bruise/bleed easily.  Psychiatric/Behavioral: Negative for depression and memory loss. The patient is not nervous/anxious and does not have insomnia.   All other systems reviewed and are negative.   Performance status (ECOG): 1  Vitals Blood pressure (!) 141/62, pulse 62, temperature (!) 96.4 F (35.8 C),  temperature source Tympanic, resp. rate 18, weight 158 lb 11.7 oz (72 kg), SpO2 100 %.   Physical Exam Vitals and nursing note reviewed.  Constitutional:      General: He is not in acute distress.    Appearance: He is not diaphoretic.  HENT:     Head: Normocephalic and atraumatic.     Comments: Gray hair.    Mouth/Throat:     Mouth: Mucous membranes are moist.     Pharynx: Oropharynx is clear.  Eyes:     General: No scleral icterus.    Extraocular Movements: Extraocular movements intact.     Conjunctiva/sclera: Conjunctivae normal.     Pupils: Pupils are equal, round, and reactive to light.     Comments: Blue eyes.  Cardiovascular:     Rate and Rhythm: Normal rate and regular rhythm.     Heart sounds: Normal heart sounds. No murmur heard.   Pulmonary:     Effort: Pulmonary effort is normal. No respiratory distress.     Breath sounds: Normal breath sounds. No wheezing or rales.  Chest:     Chest wall: No tenderness.  Breasts:     Right: No axillary adenopathy or supraclavicular adenopathy.     Left: No axillary adenopathy or supraclavicular adenopathy.    Abdominal:     General: Bowel sounds are normal. There is no distension.     Palpations: Abdomen is soft. There is no mass.     Tenderness: There is no abdominal tenderness. There is no guarding or rebound.  Musculoskeletal:        General: No swelling or tenderness. Normal range of motion.     Cervical back: Normal range of motion and neck supple.  Lymphadenopathy:     Head:     Right side of head: No preauricular, posterior auricular or occipital adenopathy.     Left side of head: No preauricular, posterior auricular or occipital adenopathy.     Cervical: No cervical adenopathy.     Upper Body:     Right upper body: No supraclavicular or axillary adenopathy.     Left upper body: No supraclavicular or axillary adenopathy.     Lower Body: No right inguinal adenopathy. No left inguinal adenopathy.  Skin:    General:  Skin is warm and dry.  Neurological:     Mental Status: He is alert and oriented to person, place, and time.  Psychiatric:        Behavior: Behavior normal.        Thought Content: Thought content normal.        Judgment: Judgment normal.     No visits with results within 3 Day(s) from this visit.  Latest known visit with results is:  Admission on  02/28/2019, Discharged on 02/28/2019  Component Date Value Ref Range Status  . Glucose-Capillary 02/28/2019 126* 70 - 99 mg/dL Final  . Glucose-Capillary 02/28/2019 138* 70 - 99 mg/dL Final    Assessment:  Isaac Ruiz is a 79 y.o. 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  Labs on 03/25/2020 revealed a hematocrit of 42.9, hemoglobin 14.8, MCV 85, platelets 180,000, WBC 5,700 (ANC 3600).  Normal labs included:  creatinine (1.2), LFTs, albumen, calcium, CRP, sed rate, and TSH.  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 Random UPEP was normal.  CXR on 12/25/2019 was negative for acute process.    Symptomatically, he has been "fine". He reports sweats if his blood sugars are not normal. Weight loss began when he started taking Jardiance. He denies fevers, sweats, bone pain or issues with infections.  Exam is unremarkable.  Plan: 1.   Labs today:  CBC with diff, CMP, myeloma panel, FLCA, LDH, PSA. 2.   24 hour urine for UPEP and free light chains. 3.   Monoclonal gammopathy  Discuss usual SPEP of 0.11gm/dL with normal random UPEP.  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  Discuss work-up. 4.   Weight loss  Patient feels weight loss is secondary to initiation of Jardiance.  Weight has fluctuated between 162 and 172 pounds in the past 5 years.  At his heaviest, he weighed 200 lbs about 20 years ago.  Current weight is 158 pounds.  Colonoscopy on 09/29/2015 revealedonly diverticulosis in the sigmoid colon.  Check PSA. 5.    RTC on 04/14/2020 for MD assessment, review of work-up and discussion regarding direction of therapy.  I discussed the assessment and treatment plan with the patient.  The patient was provided an opportunity to ask questions and all were answered.  The patient agreed with the plan and demonstrated an understanding of the instructions.  The patient was advised to call back if the symptoms worsen or if the condition fails to improve as anticipated.  I provided 26 minutes of face-to-face time during this this encounter and > 50% was spent counseling as documented under my assessment and plan. An additional 10 minutes were spent reviewing his chart (Epic and Care Everywhere) including notes, labs, and imaging studies.    Deseri Loss C. Mike Gip, MD, PhD    04/02/2020, 11:35 AM  I, Mirian Mo Tufford, am acting as Education administrator for [employer]. Mike Gip, MD, PhD.  I, Eliakim Tendler C. Mike Gip, MD, have reviewed the above documentation for accuracy and completeness, and I agree with the above.

## 2020-04-02 ENCOUNTER — Encounter: Payer: Self-pay | Admitting: Hematology and Oncology

## 2020-04-02 ENCOUNTER — Inpatient Hospital Stay: Payer: Medicare HMO

## 2020-04-02 ENCOUNTER — Inpatient Hospital Stay: Payer: Medicare HMO | Attending: Hematology and Oncology | Admitting: Hematology and Oncology

## 2020-04-02 ENCOUNTER — Other Ambulatory Visit: Payer: Self-pay

## 2020-04-02 VITALS — BP 141/62 | HR 62 | Temp 96.4°F | Resp 18 | Wt 158.7 lb

## 2020-04-02 DIAGNOSIS — Z125 Encounter for screening for malignant neoplasm of prostate: Secondary | ICD-10-CM | POA: Insufficient documentation

## 2020-04-02 DIAGNOSIS — R634 Abnormal weight loss: Secondary | ICD-10-CM

## 2020-04-02 DIAGNOSIS — D472 Monoclonal gammopathy: Secondary | ICD-10-CM | POA: Insufficient documentation

## 2020-04-02 LAB — COMPREHENSIVE METABOLIC PANEL
ALT: 16 U/L (ref 0–44)
AST: 22 U/L (ref 15–41)
Albumin: 4.5 g/dL (ref 3.5–5.0)
Alkaline Phosphatase: 48 U/L (ref 38–126)
Anion gap: 8 (ref 5–15)
BUN: 22 mg/dL (ref 8–23)
CO2: 25 mmol/L (ref 22–32)
Calcium: 9.5 mg/dL (ref 8.9–10.3)
Chloride: 105 mmol/L (ref 98–111)
Creatinine, Ser: 1.11 mg/dL (ref 0.61–1.24)
GFR, Estimated: >60 mL/min (ref >60)
Glucose, Bld: 93 mg/dL (ref 70–99)
Potassium: 4.2 mmol/L (ref 3.5–5.1)
Sodium: 138 mmol/L (ref 135–145)
Total Bilirubin: 1.1 mg/dL (ref 0.3–1.2)
Total Protein: 8 g/dL (ref 6.5–8.1)

## 2020-04-02 LAB — CBC WITH DIFFERENTIAL/PLATELET
Abs Immature Granulocytes: 0.02 10*3/uL (ref 0.00–0.07)
Basophils Absolute: 0.1 10*3/uL (ref 0.0–0.1)
Basophils Relative: 1 %
Eosinophils Absolute: 0.3 10*3/uL (ref 0.0–0.5)
Eosinophils Relative: 4 %
HCT: 43.6 % (ref 39.0–52.0)
Hemoglobin: 14.9 g/dL (ref 13.0–17.0)
Immature Granulocytes: 0 %
Lymphocytes Relative: 22 %
Lymphs Abs: 1.6 10*3/uL (ref 0.7–4.0)
MCH: 29 pg (ref 26.0–34.0)
MCHC: 34.2 g/dL (ref 30.0–36.0)
MCV: 84.8 fL (ref 80.0–100.0)
Monocytes Absolute: 0.8 10*3/uL (ref 0.1–1.0)
Monocytes Relative: 10 %
Neutro Abs: 4.6 10*3/uL (ref 1.7–7.7)
Neutrophils Relative %: 63 %
Platelets: 214 10*3/uL (ref 150–400)
RBC: 5.14 MIL/uL (ref 4.22–5.81)
RDW: 14 % (ref 11.5–15.5)
WBC: 7.2 10*3/uL (ref 4.0–10.5)
nRBC: 0 % (ref 0.0–0.2)

## 2020-04-02 LAB — LACTATE DEHYDROGENASE: LDH: 150 U/L (ref 98–192)

## 2020-04-03 LAB — PSA: Prostatic Specific Antigen: 1.18 ng/mL (ref 0.00–4.00)

## 2020-04-04 LAB — KAPPA/LAMBDA LIGHT CHAINS
Kappa free light chain: 39.3 mg/L — ABNORMAL HIGH (ref 3.3–19.4)
Kappa, lambda light chain ratio: 2.14 — ABNORMAL HIGH (ref 0.26–1.65)
Lambda free light chains: 18.4 mg/L (ref 5.7–26.3)

## 2020-04-08 ENCOUNTER — Other Ambulatory Visit: Payer: Self-pay

## 2020-04-08 DIAGNOSIS — D472 Monoclonal gammopathy: Secondary | ICD-10-CM

## 2020-04-08 LAB — MULTIPLE MYELOMA PANEL, SERUM
Albumin SerPl Elph-Mcnc: 4 g/dL (ref 2.9–4.4)
Albumin/Glob SerPl: 1.3 (ref 0.7–1.7)
Alpha 1: 0.2 g/dL (ref 0.0–0.4)
Alpha2 Glob SerPl Elph-Mcnc: 0.8 g/dL (ref 0.4–1.0)
B-Globulin SerPl Elph-Mcnc: 1.2 g/dL (ref 0.7–1.3)
Gamma Glob SerPl Elph-Mcnc: 1.1 g/dL (ref 0.4–1.8)
Globulin, Total: 3.3 g/dL (ref 2.2–3.9)
IgA: 296 mg/dL (ref 61–437)
IgG (Immunoglobin G), Serum: 1053 mg/dL (ref 603–1613)
IgM (Immunoglobulin M), Srm: 72 mg/dL (ref 15–143)
Total Protein ELP: 7.3 g/dL (ref 6.0–8.5)

## 2020-04-10 ENCOUNTER — Other Ambulatory Visit: Payer: Self-pay

## 2020-04-10 DIAGNOSIS — D472 Monoclonal gammopathy: Secondary | ICD-10-CM

## 2020-04-11 LAB — FREE K+L LT CHAINS,QN,UR
Free Kappa Lt Chains,Ur: 15.47 mg/L (ref 0.63–113.79)
Free Kappa/Lambda Ratio: 7.03 (ref 1.03–31.76)
Free Lambda Lt Chains,Ur: 2.2 mg/L (ref 0.47–11.77)
Total Volume: 3700

## 2020-04-15 LAB — UPEP/TP, 24-HR URINE
Albumin, U: 0 %
Alpha 1, Urine: 0 %
Alpha 2, Urine: 0 %
Beta, Urine: 0 %
Gamma Globulin, Urine: 0 %
Total Protein, Urine-Ur/day: <148 mg/24 hr (ref 30–150)
Total Protein, Urine: <4 mg/dL
Total Volume: 3700

## 2020-04-15 NOTE — Progress Notes (Signed)
[facility]  [address] [place], [place] [address] Phone: [phone]  Fax: [phone]   Clinic Day:  04/16/2020  Referring physician: Langley Gauss Primary Ca*  Chief Complaint: Isaac Ruiz is a 79 y.o. male with an abnormal SPEP who is seen for 2 week assessment, review of work-up and discussion regarding direction of therapy.  HPI: The patient was last seen in the hematology clinic on 04/02/2020 for new patient assessment.  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 Symptomatically, he felt "fine".  His weight loss began with initiation of Jardiance.  He denied any fevers or sweats, issues with infections, or bone pain.  Work-up revealed a hematocrit of 43.6, hemoglobin 14.9, platelets 214,000, WBC 7,200 (ANC 4600).  Creatinine was 1.11, calcium 9.5, and albumen 4.5. LDH was 150. PSA was 1.18. SPEP revealed no M-spike; immunofixation revealed an IgG monoclonal protein with lambda light chain specificity. Immunoglobulin levels were normal.  Kappa free light chains were 39.3, lambda free light chains 18.4, and ratio 2.14 (0.26-1.65).  24 hour UPEP revealed no monoclonal protein; urine kappa free light chains were 15.47, lambda free light chain 2.20, and ratio 7.03 (normal).  During the interim, he has been "good." He is eating well. His energy is "pretty good." He denies nausea, vomiting, and diarrhea.   Past Medical History:  Diagnosis Date  . Anginal pain ([place])   . Complication of anesthesia   . Coronary artery disease   . Dental bridge present    top  . Diabetes mellitus without complication ([place])   . GERD (gastroesophageal reflux disease)   . Hypertension   . Lipids blood increased   . Motion sickness    deep sea fishing  . PONV (postoperative nausea and vomiting)   . Wears dentures    partial lower    Past Surgical History:  Procedure Laterality Date  . BACK SURGERY    . CARDIAC  CATHETERIZATION    . CARDIAC CATHETERIZATION N/A 09/11/2014   Procedure: Left Heart Cath;  Surgeon: Isaias Cowman, MD;  Location: [place] [facility];  Service: Cardiovascular;  Laterality: N/A;  . CARDIAC CATHETERIZATION N/A 09/11/2014   Procedure: Coronary Stent Intervention;  Surgeon: Isaias Cowman, MD;  Location: [place] [facility];  Service: Cardiovascular;  Laterality: N/A;  . CATARACT EXTRACTION W/PHACO Left 01/31/2019   Procedure: CATARACT EXTRACTION PHACO AND INTRAOCULAR LENS PLACEMENT (IOC) LEFT DIABETIC  01:27.0  16.4%  14.29;  Surgeon: Leandrew Koyanagi, MD;  Location: [place];  Service: Ophthalmology;  Laterality: Left;  Diabetic - oral meds  . CATARACT EXTRACTION W/PHACO Right 02/28/2019   Procedure: CATARACT EXTRACTION PHACO AND INTRAOCULAR LENS PLACEMENT (IOC) RIGHT DIABETIC 00:58.5  16.5%  9.66;  Surgeon: Leandrew Koyanagi, MD;  Location: [place];  Service: Ophthalmology;  Laterality: Right;  Diabetic - oral meds  . COLONOSCOPY    . COLONOSCOPY WITH PROPOFOL N/A 09/29/2015   Procedure: COLONOSCOPY WITH PROPOFOL;  Surgeon: Manya Silvas, MD;  Location: [facility];  Service: Endoscopy;  Laterality: N/A;  . CORONARY ANGIOPLASTY    . NASAL SINUS SURGERY    . TONSILLECTOMY      History reviewed. No pertinent family history.  Social History:  reports that he has never smoked. He has never used smokeless tobacco. He reports that he does not drink alcohol and does not use drugs. He does not smoke. He has not drank any alcohol since he was diagnosed with diabetes in  1992. He used to be in [employer]. He was exposed to a "radiation room" in the [employer]. He is a retired Chief Financial Officer. His wife passed away from breast cancer in 2013. The patient is accompanied by his daughter, Tye [place], today.  Allergies:  Allergies  Allergen Reactions  . Atenolol Shortness Of Breath  . Cardura [Doxazosin] Other (See Comments)    Renal issues  . Ciprofloxacin      hypoglycemia  . Fenofibrate Itching  . Glucophage [Metformin Hcl] Other (See Comments)    flatua  . Hydrochlorothiazide Other (See Comments)  . Lipitor [Atorvastatin] Other (See Comments)    aching  . Metoprolol   . Niacin Other (See Comments)  . Other     (d)-limonene Flavor  . Pioglitazone Dermatitis  . Pravastatin Other (See Comments)    hyperglycemia  . Rosuvastatin   . Statins   . Vitamin D Analogs   . Zetia [Ezetimibe]     Current Medications: Current Outpatient Medications  Medication Sig Dispense Refill  . aspirin 81 MG chewable tablet Chew by mouth daily.    . clopidogrel (PLAVIX) 75 MG tablet Take 1 tablet (75 mg total) by mouth daily with breakfast. 30 tablet 6  . empagliflozin (JARDIANCE) 25 MG TABS tablet Take by mouth daily. Patient takes 0.5 tablet a day (12.5 mg total)    . famotidine (PEPCID) 20 MG tablet Take 20 mg by mouth 2 (two) times daily as needed.     Marland Kitchen glipiZIDE (GLUCOTROL) 10 MG tablet Take 20 mg by mouth 2 (two) times daily before a meal.     . glucose blood (PRECISION QID TEST) test strip Use 1 each (1 strip total) 3 (three) times daily One Touch Ultra.Use as instructed.    Marland Kitchen lisinopril (PRINIVIL,ZESTRIL) 40 MG tablet Take 40 mg by mouth daily.    Glory Rosebush Delica Lancets 47M MISC 50 each by Other route once daily    . SENNA PO Take by mouth as needed.    . sildenafil (REVATIO) 20 MG tablet Take 100 mg by mouth daily as needed. (Patient not taking: Reported on 04/02/2020)     No current facility-administered medications for this visit.    Review of Systems  Constitutional: Negative for chills, diaphoresis (if his blood sugars are abnormal), fever, malaise/fatigue and weight loss (up 2 lbs).       Feels good. Energy is "pretty good."  HENT: Negative for congestion, ear discharge, ear pain, hearing loss, nosebleeds, sinus pain, sore throat and tinnitus.   Eyes: Negative for blurred vision.  Respiratory: Negative for cough, hemoptysis, sputum  production and shortness of breath.   Cardiovascular: Negative for chest pain, palpitations and leg swelling.  Gastrointestinal: Negative for abdominal pain, blood in stool, constipation, diarrhea, heartburn, melena, nausea and vomiting.  Genitourinary: Negative for dysuria, frequency, hematuria and urgency.  Musculoskeletal: Negative for back pain, joint pain, myalgias and neck pain.  Skin: Negative for itching and rash.  Neurological: Negative for dizziness, tingling, sensory change, weakness and headaches.  Endo/Heme/Allergies: Does not bruise/bleed easily.  Psychiatric/Behavioral: Negative for depression and memory loss. The patient is not nervous/anxious and does not have insomnia.   All other systems reviewed and are negative.   Performance status (ECOG): 0  Vitals Blood pressure (!) 151/59, pulse (!) 59, temperature 97.8 F (36.6 C), resp. rate 20, weight 160 lb 6.2 oz (72.7 kg), SpO2 100 %.   Physical Exam Vitals and nursing note reviewed.  Constitutional:      General: He  is not in acute distress.    Appearance: He is not diaphoretic.  HENT:     Head: Normocephalic and atraumatic.     Comments: Short gray hair. Eyes:     General: No scleral icterus.    Conjunctiva/sclera: Conjunctivae normal.     Comments: Blue eyes.  Neurological:     Mental Status: He is alert and oriented to person, place, and time.  Psychiatric:        Behavior: Behavior normal.        Thought Content: Thought content normal.        Judgment: Judgment normal.    No visits with results within 3 Day(s) from this visit.  Latest known visit with results is:  Orders Only on 04/10/2020  Component Date Value Ref Range Status  . Free Kappa Lt Chains,Ur 04/10/2020 15.47  0.63 - 113.79 mg/L Final  . Free Lambda Lt Chains,Ur 04/10/2020 2.20  0.47 - 11.77 mg/L Final  . Free Kappa/Lambda Ratio 04/10/2020 7.03  1.03 - 31.76 Final   Comment: (NOTE) Performed At: [facility] [place], [place] [id number] Rush Farmer MD RJ:[id number]   . Total Volume 04/10/2020 3,700   Final   Performed at [facility], [address] [place], [place] [address]  . Total Protein, Urine 04/10/2020 <4.0  Not Estab. mg/dL Final  . Total Protein, Urine-Ur/day 04/10/2020 <148  30 - 150 mg/24 hr Final  . Albumin, U 04/10/2020 0.0  % Final   Comment: (NOTE) No urine protein electrophoretic pattern was observed after concentration. However, there is a detectable amount of protein by a quantitative spectrophotometric method.   . Alpha 1, Urine 04/10/2020 0.0  % Final  . Alpha 2, Urine 04/10/2020 0.0  % Final  . Beta, Urine 04/10/2020 0.0  % Final  . Gamma Globulin, Urine 04/10/2020 0.0  % Final  . M-spike, % 04/10/2020 Not Observed  Not Observed % Final  . Please Note: 04/10/2020 Comment   Final   Comment: (NOTE) Protein electrophoresis scan will follow via computer, mail, or courier delivery. Performed At: [facility] [place], [place] [id number] Rush Farmer MD UK:[id number]   . Total Volume 04/10/2020 3,700   Final   Performed at [facility], [address], [place],  [address]    Assessment:  KEAGON GLASCOE is a 79 y.o. 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  Labs on 03/25/2020 revealed a hematocrit of 42.9, hemoglobin 14.8, MCV 85, platelets 180,000, WBC 5,700 (ANC 3600).  Normal labs included:  creatinine (1.2), LFTs, albumen, calcium, CRP, sed rate, and TSH.  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 Random UPEP was normal.  CXR on 12/25/2019 was negative for acute process.    Work-up on 04/02/2020 revealed a hematocrit of 43.6, hemoglobin 14.9, platelets 214,000, WBC 7,200 (ANC 4600).  Creatinine was 1.11, calcium 9.5, and albumen 4.5. LDH was 150.  SPEP revealed no M-spike; immunofixation revealed an IgG monoclonal protein with  lambda light chain specificity. Immunoglobulin levels were normal.  Kappa free light chains were 39.3, lambda free light chains 18.4, and ratio 2.14 (0.26-1.65).  24 hour UPEP revealed no monoclonal protein; urine kappa free light chains were 15.47, lambda free light chain 2.20, and ratio 7.03 (normal).    He has had weight loss possibly related to Jardiance.  PSA was 1.18 on 04/02/2020.   Symptomatically, he feels good.  Exam is unremarkable.  Plan: 1.  Monoclonal gammopathy  SPEP on 03/25/2020 revealed a 0.11 gm/dL IgG monoclonal protein with lambda light chain specificity.  Work-up on 04/02/2020 revealed no monoclonal protein.  Free light chain ratio was slightly elevated.  Patient has no CRAB criteria.  Discussed plan for surveillance. 2.   Weight loss  Weight up 2 pounds.  Patient feels weight loss is secondary to Jardiance.  Continue to monitor. 3.   RTC in 6 months for labs (CBC with diff, CMP, SPEP, FLCA). 4.   RTC in 12 months for MD assessment, labs (CBC with diff, CMP, SPEP, FLCA, LDH).  I discussed the assessment and treatment plan with the patient.  The patient was provided an opportunity to ask questions and all were answered.  The patient agreed with the plan and demonstrated an understanding of the instructions.  The patient was advised to call back if the symptoms worsen or if the condition fails to improve as anticipated.    C. Mike Gip, MD, PhD    04/16/2020, 1:37 PM  I, Mirian Mo Tufford, am acting as Education administrator for [employer]. Mike Gip, MD, PhD.  I,  C. Mike Gip, MD, have reviewed the above documentation for accuracy and completeness, and I agree with the above.

## 2020-04-16 ENCOUNTER — Encounter: Payer: Self-pay | Admitting: Hematology and Oncology

## 2020-04-16 ENCOUNTER — Other Ambulatory Visit: Payer: Self-pay

## 2020-04-16 ENCOUNTER — Inpatient Hospital Stay: Payer: Medicare HMO | Attending: Hematology and Oncology | Admitting: Hematology and Oncology

## 2020-04-16 VITALS — BP 151/59 | HR 59 | Temp 97.8°F | Resp 20 | Wt 160.4 lb

## 2020-04-16 DIAGNOSIS — Z7984 Long term (current) use of oral hypoglycemic drugs: Secondary | ICD-10-CM | POA: Insufficient documentation

## 2020-04-16 DIAGNOSIS — I10 Essential (primary) hypertension: Secondary | ICD-10-CM | POA: Diagnosis not present

## 2020-04-16 DIAGNOSIS — E119 Type 2 diabetes mellitus without complications: Secondary | ICD-10-CM | POA: Diagnosis not present

## 2020-04-16 DIAGNOSIS — Z7982 Long term (current) use of aspirin: Secondary | ICD-10-CM | POA: Insufficient documentation

## 2020-04-16 DIAGNOSIS — K219 Gastro-esophageal reflux disease without esophagitis: Secondary | ICD-10-CM | POA: Insufficient documentation

## 2020-04-16 DIAGNOSIS — Z79899 Other long term (current) drug therapy: Secondary | ICD-10-CM | POA: Insufficient documentation

## 2020-04-16 DIAGNOSIS — R634 Abnormal weight loss: Secondary | ICD-10-CM | POA: Diagnosis not present

## 2020-04-16 DIAGNOSIS — D472 Monoclonal gammopathy: Secondary | ICD-10-CM | POA: Diagnosis not present

## 2020-04-16 DIAGNOSIS — I2510 Atherosclerotic heart disease of native coronary artery without angina pectoris: Secondary | ICD-10-CM | POA: Insufficient documentation

## 2020-06-06 ENCOUNTER — Telehealth: Payer: Self-pay | Admitting: Hematology and Oncology

## 2020-06-06 NOTE — Telephone Encounter (Signed)
06/06/2020 Called pt and informed him that 6 mo f/u labs would have to be r/s from 6/7 due to the clinic being closed 6/6-6/10. Appt moved to 6/15 and pt confirmed this change. Will put an updated AVS in the mail to him as well  SRW

## 2020-09-08 ENCOUNTER — Ambulatory Visit (INDEPENDENT_AMBULATORY_CARE_PROVIDER_SITE_OTHER): Payer: Medicare HMO

## 2020-09-08 ENCOUNTER — Ambulatory Visit
Admission: EM | Admit: 2020-09-08 | Discharge: 2020-09-08 | Disposition: A | Discharge status: Home / Self Care | Payer: Medicare HMO | Attending: Family Medicine | Admitting: Family Medicine

## 2020-09-08 ENCOUNTER — Other Ambulatory Visit: Payer: Self-pay

## 2020-09-08 DIAGNOSIS — M79645 Pain in left finger(s): Secondary | ICD-10-CM | POA: Diagnosis not present

## 2020-09-08 MED ORDER — DICLOFENAC SODIUM 1 % EX GEL: 2.0000 g | Freq: Four times a day (QID) | CUTANEOUS | 0 refills | Status: DC | PRN

## 2020-09-08 NOTE — Discharge Instructions (Addendum)
Xray unremarkable.   Medication as directed.  Follow up with your PCP.  Take care  Dr. Lacinda Axon

## 2020-09-08 NOTE — ED Triage Notes (Signed)
Patient states that he injured his left middle finger around 2 months ago. States that he has been having tenderness in this area since. States that he saw PCP and was given steroids which helped some. States that initial injury he hit it with a leaf blower. Patient states that he would like a x-ray.

## 2020-09-08 NOTE — ED Provider Notes (Signed)
[facility]    CSN: [id number] Arrival date & time: 09/08/20  0806      History   Chief Complaint Chief Complaint  Patient presents with  . Finger Injury   HPI  80 year old male presents with the above complaints  Patient injured his left middle finger approximately 2 months ago.  He saw his PCP on 3/17.  Was treated with prednisone.  Patient reports that he continues to have pain particularly at the PIP joint.  Has difficulty making a fist.  He states that he would like it x-rayed today.  Apparently he injured it while using a leaf blower.  He states that the pain is sharp and throbbing.  Seems to be worse with range of motion.  No other complaints.  Past Medical History:  Diagnosis Date  . Anginal pain ([place])   . Complication of anesthesia   . Coronary artery disease   . Dental bridge present    top  . Diabetes mellitus without complication ([place])   . GERD (gastroesophageal reflux disease)   . Hypertension   . Lipids blood increased   . Motion sickness    deep sea fishing  . PONV (postoperative nausea and vomiting)   . Wears dentures    partial lower    Patient Active Problem List   Diagnosis Date Noted  . IgG monoclonal gammopathy 04/02/2020  . Weight loss 04/02/2020  . Chest pain with high risk for cardiac etiology 09/11/2014  . Atherosclerosis of native coronary artery of native heart with unstable angina pectoris ([place]) 09/11/2014    Past Surgical History:  Procedure Laterality Date  . BACK SURGERY    . CARDIAC CATHETERIZATION    . CARDIAC CATHETERIZATION N/A 09/11/2014   Procedure: Left Heart Cath;  Surgeon: Isaias Cowman, MD;  Location: [place] [facility];  Service: Cardiovascular;  Laterality: N/A;  . CARDIAC CATHETERIZATION N/A 09/11/2014   Procedure: Coronary Stent Intervention;  Surgeon: Isaias Cowman, MD;  Location: [place] [facility];  Service: Cardiovascular;  Laterality: N/A;  . CATARACT EXTRACTION W/PHACO Left 01/31/2019    Procedure: CATARACT EXTRACTION PHACO AND INTRAOCULAR LENS PLACEMENT (IOC) LEFT DIABETIC  01:27.0  16.4%  14.29;  Surgeon: Leandrew Koyanagi, MD;  Location: [place];  Service: Ophthalmology;  Laterality: Left;  Diabetic - oral meds  . CATARACT EXTRACTION W/PHACO Right 02/28/2019   Procedure: CATARACT EXTRACTION PHACO AND INTRAOCULAR LENS PLACEMENT (IOC) RIGHT DIABETIC 00:58.5  16.5%  9.66;  Surgeon: Leandrew Koyanagi, MD;  Location: [place];  Service: Ophthalmology;  Laterality: Right;  Diabetic - oral meds  . COLONOSCOPY    . COLONOSCOPY WITH PROPOFOL N/A 09/29/2015   Procedure: COLONOSCOPY WITH PROPOFOL;  Surgeon: Manya Silvas, MD;  Location: [facility];  Service: Endoscopy;  Laterality: N/A;  . CORONARY ANGIOPLASTY    . NASAL SINUS SURGERY    . TONSILLECTOMY         Home Medications    Prior to Admission medications   Medication Sig Start Date End Date Taking? Authorizing Provider  aspirin 81 MG chewable tablet Chew by mouth daily.   Yes [provider]  clopidogrel (PLAVIX) 75 MG tablet Take 1 tablet (75 mg total) by mouth daily with breakfast. 09/12/14  Yes Paraschos, Alexander, MD  diclofenac Sodium (VOLTAREN) 1 % GEL Apply 2 g topically 4 (four) times daily as needed. 09/08/20  Yes Enyah Moman G, DO  empagliflozin (JARDIANCE) 25 MG TABS tablet Take by mouth daily. Patient takes 0.5 tablet a day (  12.5 mg total)   Yes [provider]  famotidine (PEPCID) 20 MG tablet Take 20 mg by mouth 2 (two) times daily as needed.    Yes [provider]  glipiZIDE (GLUCOTROL) 10 MG tablet Take 20 mg by mouth 2 (two) times daily before a meal.    Yes [provider]  glucose blood (PRECISION QID TEST) test strip Use 1 each (1 strip total) 3 (three) times daily One Touch Ultra.Use as instructed. 08/02/18  Yes [provider]  lisinopril (PRINIVIL,ZESTRIL) 40 MG tablet Take 40 mg by mouth daily.   Yes [provider]   OneTouch Delica Lancets 26Z MISC 50 each by Other route once daily 03/06/18  Yes [provider]  SENNA PO Take by mouth as needed.   Yes [provider]  sildenafil (REVATIO) 20 MG tablet Take 100 mg by mouth daily as needed.   Yes [provider]   Social History Social History   Tobacco Use  . Smoking status: Never Smoker  . Smokeless tobacco: Never Used  Vaping Use  . Vaping Use: Never used  Substance Use Topics  . Alcohol use: No  . Drug use: No     Allergies   Atenolol, Cardura [doxazosin], Ciprofloxacin, Fenofibrate, Glucophage [metformin hcl], Hydrochlorothiazide, Lipitor [atorvastatin], Metoprolol, Niacin, Other, Pioglitazone, Pravastatin, Rosuvastatin, Statins, Vitamin d analogs, and Zetia [ezetimibe]   Review of Systems Review of Systems  Constitutional: Negative.   Musculoskeletal:       Left middle finger pain.   Physical Exam Triage Vital Signs ED Triage Vitals  Enc Vitals Group     BP 09/08/20 0817 (!) 150/65     Pulse Rate 09/08/20 0817 (!) 59     Resp 09/08/20 0817 18     Temp 09/08/20 0817 98 F (36.7 C)     Temp Source 09/08/20 0817 Oral     SpO2 09/08/20 0817 100 %     Weight 09/08/20 0815 158 lb (71.7 kg)     Height 09/08/20 0815 5\' 9"  (1.753 m)     Head Circumference --      Peak Flow --      Pain Score 09/08/20 0815 8     Pain Loc --      Pain Edu? --      Excl. in [place]? --    Updated Vital Signs BP (!) 150/65 (BP Location: Left Arm)   Pulse (!) 59   Temp 98 F (36.7 C) (Oral)   Resp 18   Ht 5\' 9"  (1.753 m)   Wt 71.7 kg   SpO2 100%   BMI 23.33 kg/m   Visual Acuity Right Eye Distance:   Left Eye Distance:   Bilateral Distance:    Right Eye Near:   Left Eye Near:    Bilateral Near:     Physical Exam Vitals and nursing note reviewed.  Constitutional:      General: He is not in acute distress.    Appearance: Normal appearance. He is not ill-appearing.  HENT:     Head: Normocephalic and atraumatic.   Eyes:     General:        Right eye: No discharge.        Left eye: No discharge.     Conjunctiva/sclera: Conjunctivae normal.  Pulmonary:     Effort: Pulmonary effort is normal. No respiratory distress.  Musculoskeletal:     Comments: Left middle finger -decreased range of motion of the left middle finger at the  PIP joint.  No appreciable joint swelling.  No erythema.  Neurological:     Mental Status: He is alert.  Psychiatric:        Mood and Affect: Mood normal.        Behavior: Behavior normal.    UC Treatments / Results  Labs (all labs ordered are listed, but only abnormal results are displayed) Labs Reviewed - No data to display  EKG   Radiology DG Finger Middle Left  Result Date: 09/08/2020 CLINICAL DATA:  Pain following injury EXAM: LEFT THIRD FINGER 2+V COMPARISON:  None. FINDINGS: Frontal, oblique, and lateral views were obtained. No evident acute fracture or dislocation. There is narrowing of the first DIP joint with spurring in this area. Other joint spaces appear normal. No erosive change. IMPRESSION: Osteoarthritic change first DIP joint. Other joint spaces appear unremarkable. No acute fracture or dislocation evident. Electronically Signed   By: Lowella Grip III M.D.   On: 09/08/2020 08:51    Procedures Procedures (including critical care time)  Medications Ordered in UC Medications - No data to display  Initial Impression / Assessment and Plan / UC Course  I have reviewed the triage vital signs and the nursing notes.  Pertinent labs & imaging results that were available during my care of the patient were reviewed by me and considered in my medical decision making (see chart for details).    80 year old male presents with pain of the left middle finger.  X-ray was obtained and was negative for fracture.  He does have some degenerative change.  Topical Voltaren as directed.  Follow-up with PCP.  Final Clinical Impressions(s) / UC Diagnoses   Final  diagnoses:  Pain of finger of left hand     Discharge Instructions     Xray unremarkable.   Medication as directed.  Follow up with your PCP.  Take care  Dr. Lacinda Axon     ED Prescriptions    Medication Sig Dispense Auth. Provider   diclofenac Sodium (VOLTAREN) 1 % GEL Apply 2 g topically 4 (four) times daily as needed. 100 g Coral Spikes, DO     PDMP not reviewed this encounter.   Thersa Salt [place], [place] 09/08/20 [phone]

## 2020-10-15 ENCOUNTER — Other Ambulatory Visit: Payer: Medicare HMO

## 2020-10-22 ENCOUNTER — Other Ambulatory Visit: Payer: Self-pay

## 2020-10-22 ENCOUNTER — Inpatient Hospital Stay: Payer: Medicare HMO | Attending: Oncology

## 2020-10-22 DIAGNOSIS — D472 Monoclonal gammopathy: Secondary | ICD-10-CM | POA: Insufficient documentation

## 2020-10-22 LAB — CBC WITH DIFFERENTIAL/PLATELET
Abs Immature Granulocytes: 0.02 10*3/uL (ref 0.00–0.07)
Basophils Absolute: 0.1 10*3/uL (ref 0.0–0.1)
Basophils Relative: 1 %
Eosinophils Absolute: 0.2 10*3/uL (ref 0.0–0.5)
Eosinophils Relative: 3 %
HCT: 42.3 % (ref 39.0–52.0)
Hemoglobin: 14.5 g/dL (ref 13.0–17.0)
Immature Granulocytes: 0 %
Lymphocytes Relative: 21 %
Lymphs Abs: 1.3 10*3/uL (ref 0.7–4.0)
MCH: 28.9 pg (ref 26.0–34.0)
MCHC: 34.3 g/dL (ref 30.0–36.0)
MCV: 84.3 fL (ref 80.0–100.0)
Monocytes Absolute: 0.7 10*3/uL (ref 0.1–1.0)
Monocytes Relative: 10 %
Neutro Abs: 4.1 10*3/uL (ref 1.7–7.7)
Neutrophils Relative %: 65 %
Platelets: 182 10*3/uL (ref 150–400)
RBC: 5.02 MIL/uL (ref 4.22–5.81)
RDW: 14.2 % (ref 11.5–15.5)
WBC: 6.2 10*3/uL (ref 4.0–10.5)
nRBC: 0 % (ref 0.0–0.2)

## 2020-10-22 LAB — COMPREHENSIVE METABOLIC PANEL
ALT: 14 U/L (ref 0–44)
AST: 22 U/L (ref 15–41)
Albumin: 4.4 g/dL (ref 3.5–5.0)
Alkaline Phosphatase: 46 U/L (ref 38–126)
Anion gap: 6 (ref 5–15)
BUN: 22 mg/dL (ref 8–23)
CO2: 26 mmol/L (ref 22–32)
Calcium: 9.2 mg/dL (ref 8.9–10.3)
Chloride: 104 mmol/L (ref 98–111)
Creatinine, Ser: 1.1 mg/dL (ref 0.61–1.24)
GFR, Estimated: >60 mL/min (ref >60)
Glucose, Bld: 228 mg/dL — ABNORMAL HIGH (ref 70–99)
Potassium: 4 mmol/L (ref 3.5–5.1)
Sodium: 136 mmol/L (ref 135–145)
Total Bilirubin: 1.2 mg/dL (ref 0.3–1.2)
Total Protein: 7.6 g/dL (ref 6.5–8.1)

## 2020-10-23 LAB — PROTEIN ELECTROPHORESIS, SERUM
A/G Ratio: 1.4 (ref 0.7–1.7)
Albumin ELP: 4.1 g/dL (ref 2.9–4.4)
Alpha-1-Globulin: 0.2 g/dL (ref 0.0–0.4)
Alpha-2-Globulin: 0.7 g/dL (ref 0.4–1.0)
Beta Globulin: 1 g/dL (ref 0.7–1.3)
Gamma Globulin: 0.9 g/dL (ref 0.4–1.8)
Globulin, Total: 2.9 g/dL (ref 2.2–3.9)
Total Protein ELP: 7 g/dL (ref 6.0–8.5)

## 2020-10-23 LAB — KAPPA/LAMBDA LIGHT CHAINS
Kappa free light chain: 34.8 mg/L — ABNORMAL HIGH (ref 3.3–19.4)
Kappa, lambda light chain ratio: 1.78 — ABNORMAL HIGH (ref 0.26–1.65)
Lambda free light chains: 19.6 mg/L (ref 5.7–26.3)

## 2020-11-11 ENCOUNTER — Telehealth: Payer: Self-pay | Admitting: *Deleted

## 2020-11-11 NOTE — Telephone Encounter (Signed)
Patient came to the clinic to discuss his Kappa Light Chain lab result from June. Patient is a former Dr. Corcoran patient and h/o of [facility]. His pcp saw him on 11/03/20 and encouraged the patient to follow-up with oncology office to discuss his questions related to this lab.  He has an apt to establish with Dr. Rao in April 15, 2021. He stated that no one had called him at this time to discuss his test results and He would like to discuss these test results.  I reviewed the labs in detail with the patient. I explained to him that the kappa light chain results were improved from the last draw 7 months ago.  Kappa light chain on 6/15 were 34.8 (see trending results below). Discussed with patient that his labs were stable and improved from the last lab draw. There are no signs of anemia or renal failure. I explained to the patient that when we see abnormal labs (Kappa Light Chains), the oncologist could be concerned that he could potentially develop multiple myeloma in his life time. Given that his labs are currently stable, that Dr. Rao would most likely just want to continue monitoring his labs at this time on a 6 month basis. Patient will keep his apt in December with Dr. Rao. He thanked me for explaining these test results.  Result Notes  Component Ref Range & Units 2 wk ago 7 mo ago  Kappa free light chain 3.3 - 19.4 mg/L 34.8 High   39.3 High    Lambda free light chains 5.7 - 26.3 mg/L 19.6  18.4   Kappa, lambda light chain ratio 0.26 - 1.65 1.78 High   2.14 High  CM      Component Ref Range & Units 2 wk ago 7 mo ago  Total Protein ELP 6.0 - 8.5 g/dL 7.0  7.3 VC   Albumin ELP 2.9 - 4.4 g/dL 4.1    Alpha-1-Globulin 0.0 - 0.4 g/dL 0.2    Alpha-2-Globulin 0.4 - 1.0 g/dL 0.7    Beta Globulin 0.7 - 1.3 g/dL 1.0    Gamma Globulin 0.4 - 1.8 g/dL 0.9    M-Spike, % Not Observed g/dL Not Observed    SPE Interp.  Comment    Comment: (NOTE)  The SPE pattern appears unremarkable. Evidence of monoclonal   protein is not apparent.  Performed At: [facility]  [address] [place], [place] [id number]  Nagendra Sanjai MD Ph:[phone]    Component Ref Range & Units 2 wk ago 7 mo ago 6 yr ago  Sodium 135 - 145 mmol/L 136  138  138   Potassium 3.5 - 5.1 mmol/L 4.0  4.2  4.3   Chloride 98 - 111 mmol/L 104  105  106 R   CO2 22 - 32 mmol/L 26  25  26   Glucose, Bld 70 - 99 mg/dL 228 High   93 CM  158 High  R   Comment: Glucose reference range applies only to samples taken after fasting for at least 8 hours.  BUN 8 - 23 mg/dL 22  22  15 R   Creatinine, Ser 0.61 - 1.24 mg/dL 1.10  1.11  0.97   Calcium 8.9 - 10.3 mg/dL 9.2  9.5  8.9   Total Protein 6.5 - 8.1 g/dL 7.6  8.0    Albumin 3.5 - 5.0 g/dL 4.4  4.5    AST 15 - 41 U/L 22  22    ALT 0 - 44   U/L 14  16    Alkaline Phosphatase 38 - 126 U/L 46  48    Total Bilirubin 0.3 - 1.2 mg/dL 1.2  1.1    GFR, Estimated >60 mL/min >60  >60 CM     Notes  Component Ref Range & Units 2 wk ago 7 mo ago 6 yr ago  WBC 4.0 - 10.5 K/uL 6.2  7.2  6.8 R   RBC 4.22 - 5.81 MIL/uL 5.02  5.14  4.56 R   Hemoglobin 13.0 - 17.0 g/dL 14.5  14.9  12.8 Low  R   HCT 39.0 - 52.0 % 42.3  43.6  38.3 Low  R   MCV 80.0 - 100.0 fL 84.3  84.8  84.0   MCH 26.0 - 34.0 pg 28.9  29.0  28.0   MCHC 30.0 - 36.0 g/dL 34.3  34.2  33.4 R   RDW 11.5 - 15.5 % 14.2  14.0  13.6 R   Platelets 150 - 400 K/uL 182  214  141 Low  R   nRBC 0.0 - 0.2 % 0.0  0.0    Neutrophils Relative % % 65  63    Neutro Abs 1.7 - 7.7 K/uL 4.1  4.6    Lymphocytes Relative % 21  22    Lymphs Abs 0.7 - 4.0 K/uL 1.3  1.6    Monocytes Relative % 10  10    Monocytes Absolute 0.1 - 1.0 K/uL 0.7  0.8    Eosinophils Relative % 3  4    Eosinophils Absolute 0.0 - 0.5 K/uL 0.2  0.3    Basophils Relative % 1  1    Basophils Absolute 0.0 - 0.1 K/uL 0.1  0.1    Immature Granulocytes % 0  0    Abs Immature Granulocytes 0.00 - 0.07 K/uL 0.02  0.02 CM    Comment: Performed at [facility],  [address], [place], [place] [address]

## 2020-11-11 NOTE — Telephone Encounter (Signed)
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 F/u in dec as planned

## 2020-11-12 ENCOUNTER — Telehealth: Payer: Self-pay | Admitting: Nurse Practitioner

## 2020-11-12 DIAGNOSIS — D472 Monoclonal gammopathy: Secondary | ICD-10-CM

## 2020-11-12 NOTE — Telephone Encounter (Signed)
Spoke to patient regarding results of mgus labs which are stable. He remains asymptomatic. Follow up in December as planned to establish care with MD.

## 2021-04-15 ENCOUNTER — Ambulatory Visit: Payer: Medicare HMO | Admitting: Oncology

## 2021-04-15 ENCOUNTER — Ambulatory Visit: Payer: Self-pay | Admitting: Oncology

## 2021-04-15 ENCOUNTER — Other Ambulatory Visit: Payer: Medicare HMO

## 2021-05-06 ENCOUNTER — Other Ambulatory Visit: Payer: Self-pay | Admitting: *Deleted

## 2021-05-06 DIAGNOSIS — D472 Monoclonal gammopathy: Secondary | ICD-10-CM

## 2021-05-09 IMAGING — CR DG FINGER MIDDLE 2+V*L*
3 series · 3 of 3 positions shown · non-contrast
Comparison: None.

CLINICAL DATA: Pain following injury

EXAM:
LEFT THIRD FINGER 2+V

[finger ap]
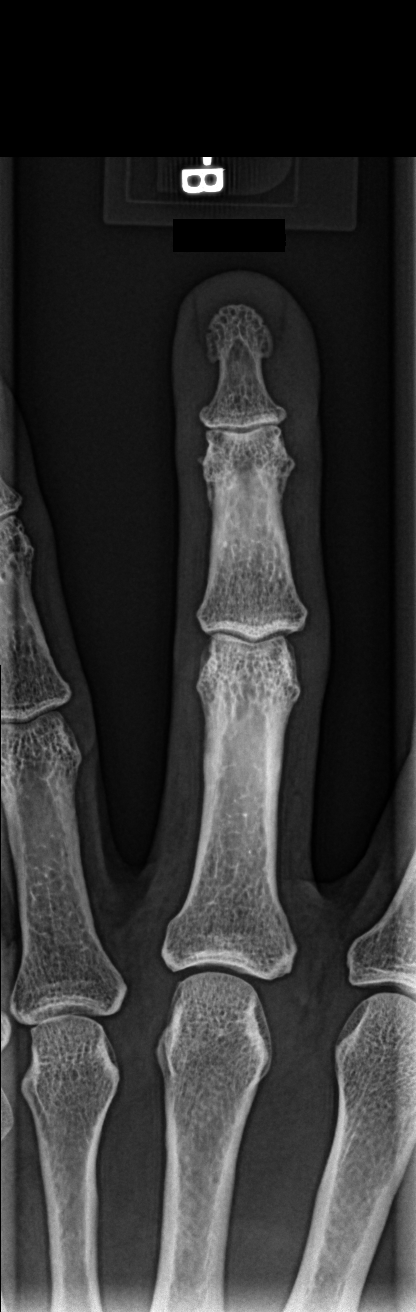

[finger obl]
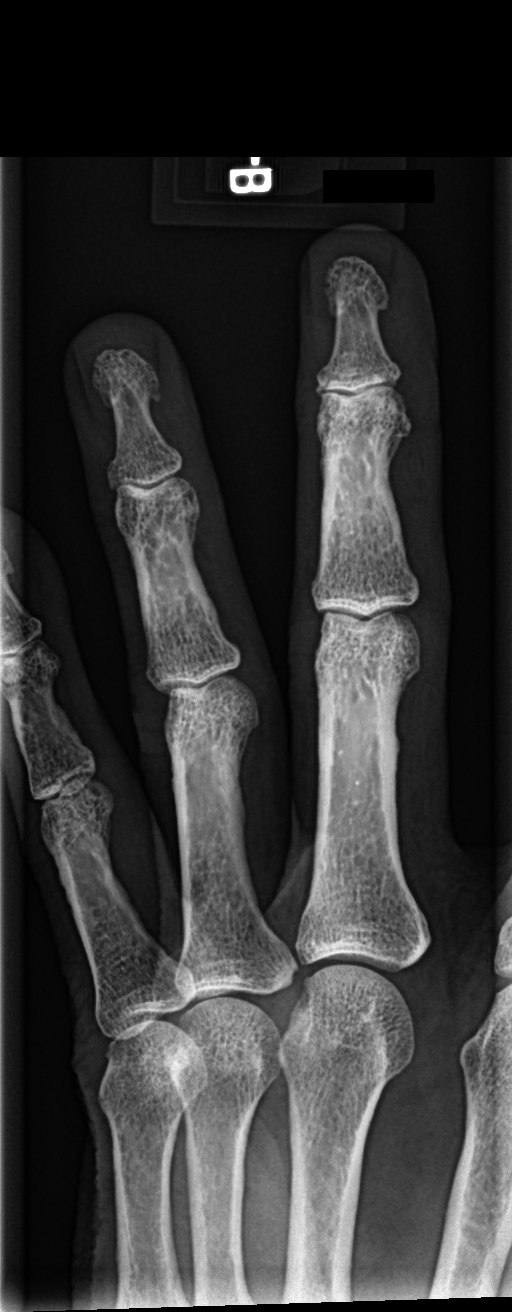

[finger lat]
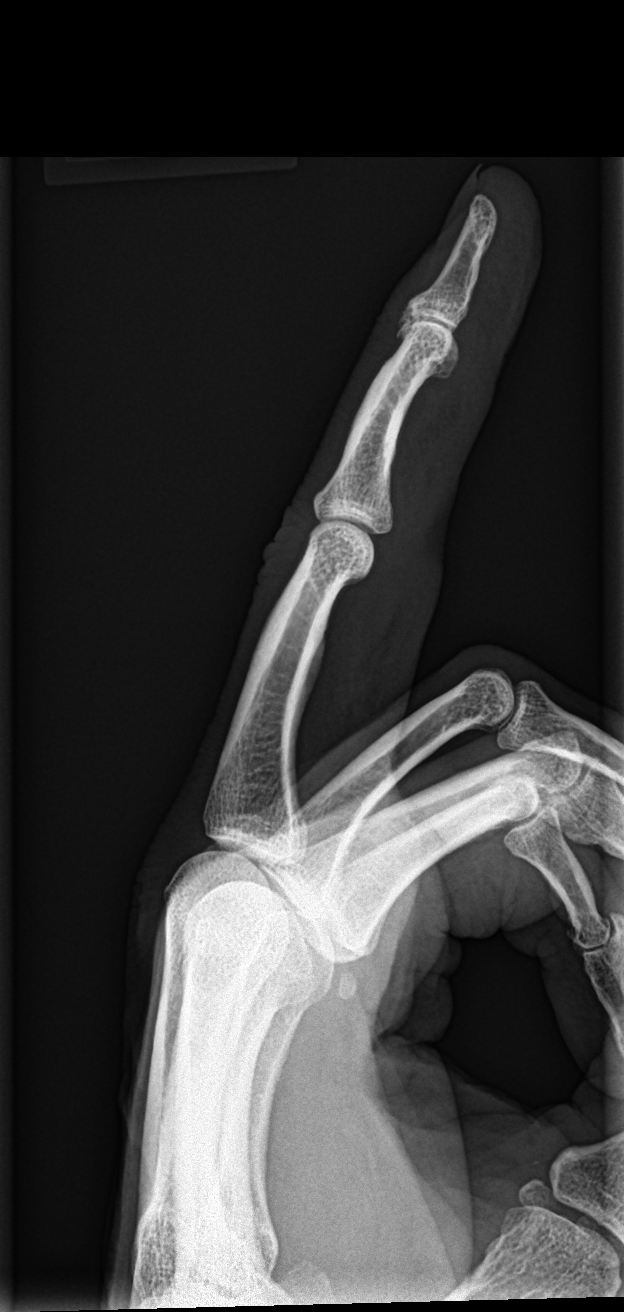

[3 of 3 positions shown; findings below may reference images not displayed]

FINDINGS: Frontal, oblique, and lateral views were obtained. No evident acute
fracture or dislocation. There is narrowing of the first DIP joint
with spurring in this area. Other joint spaces appear normal. No
erosive change.
IMPRESSION: Osteoarthritic change first DIP joint. Other joint spaces appear
unremarkable. No acute fracture or dislocation evident.

## 2021-05-13 ENCOUNTER — Other Ambulatory Visit: Payer: Medicare HMO

## 2021-05-13 ENCOUNTER — Ambulatory Visit: Payer: Self-pay | Admitting: Oncology

## 2021-05-14 ENCOUNTER — Inpatient Hospital Stay: Payer: Medicare HMO | Attending: Nurse Practitioner

## 2021-05-14 ENCOUNTER — Inpatient Hospital Stay: Payer: Medicare HMO | Admitting: Nurse Practitioner

## 2021-05-14 ENCOUNTER — Other Ambulatory Visit: Payer: Self-pay

## 2021-05-14 ENCOUNTER — Encounter: Payer: Self-pay | Admitting: Nurse Practitioner

## 2021-05-14 VITALS — BP 147/66 | HR 86 | Temp 98.6°F | Resp 20 | Wt 160.5 lb

## 2021-05-14 DIAGNOSIS — D472 Monoclonal gammopathy: Secondary | ICD-10-CM

## 2021-05-14 LAB — CBC WITH DIFFERENTIAL/PLATELET
Abs Immature Granulocytes: 0.02 10*3/uL (ref 0.00–0.07)
Basophils Absolute: 0.1 10*3/uL (ref 0.0–0.1)
Basophils Relative: 1 %
Eosinophils Absolute: 0.2 10*3/uL (ref 0.0–0.5)
Eosinophils Relative: 4 %
HCT: 45.4 % (ref 39.0–52.0)
Hemoglobin: 15.2 g/dL (ref 13.0–17.0)
Immature Granulocytes: 0 %
Lymphocytes Relative: 20 %
Lymphs Abs: 1.4 10*3/uL (ref 0.7–4.0)
MCH: 28.6 pg (ref 26.0–34.0)
MCHC: 33.5 g/dL (ref 30.0–36.0)
MCV: 85.3 fL (ref 80.0–100.0)
Monocytes Absolute: 0.8 10*3/uL (ref 0.1–1.0)
Monocytes Relative: 12 %
Neutro Abs: 4.2 10*3/uL (ref 1.7–7.7)
Neutrophils Relative %: 63 %
Platelets: 170 10*3/uL (ref 150–400)
RBC: 5.32 MIL/uL (ref 4.22–5.81)
RDW: 13.8 % (ref 11.5–15.5)
WBC: 6.6 10*3/uL (ref 4.0–10.5)
nRBC: 0 % (ref 0.0–0.2)

## 2021-05-14 LAB — COMPREHENSIVE METABOLIC PANEL
ALT: 17 U/L (ref 0–44)
AST: 23 U/L (ref 15–41)
Albumin: 4.6 g/dL (ref 3.5–5.0)
Alkaline Phosphatase: 57 U/L (ref 38–126)
Anion gap: 9 (ref 5–15)
BUN: 17 mg/dL (ref 8–23)
CO2: 26 mmol/L (ref 22–32)
Calcium: 9.7 mg/dL (ref 8.9–10.3)
Chloride: 100 mmol/L (ref 98–111)
Creatinine, Ser: 1.11 mg/dL (ref 0.61–1.24)
GFR, Estimated: >60 mL/min (ref >60)
Glucose, Bld: 164 mg/dL — ABNORMAL HIGH (ref 70–99)
Potassium: 4.3 mmol/L (ref 3.5–5.1)
Sodium: 135 mmol/L (ref 135–145)
Total Bilirubin: 0.7 mg/dL (ref 0.3–1.2)
Total Protein: 8.2 g/dL — ABNORMAL HIGH (ref 6.5–8.1)

## 2021-05-14 LAB — LACTATE DEHYDROGENASE: LDH: 152 U/L (ref 98–192)

## 2021-05-14 NOTE — Progress Notes (Signed)
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     Labs on 03/25/2020 revealed a hematocrit of 42.9, hemoglobin 14.8, MCV 85, platelets 180,000, WBC 5,700 (ANC 3600).  Normal labs included:  creatinine (1.2), LFTs, albumen, calcium, CRP, sed rate, and TSH.  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 Random UPEP was normal.  CXR on 12/25/2019 was negative for acute process.     Work-up on 04/02/2020 revealed a hematocrit of 43.6, hemoglobin 14.9, platelets 214,000, WBC 7,200 (ANC 4600).  Creatinine was 1.11, calcium 9.5, and albumen 4.5. LDH was 150.  SPEP revealed no M-spike; immunofixation revealed an IgG monoclonal protein with lambda light chain specificity. Immunoglobulin levels were normal.  Kappa free light chains were 39.3, lambda free light chains 18.4, and ratio 2.14 (0.26-1.65).  24 hour UPEP revealed no monoclonal protein; urine kappa free light chains were 15.47, lambda free light chain 2.20, and ratio 7.03 (normal).   Interval History: Patient is 81 year old male who returns to clinic with his daughter for labs and follow-up for history of MGUS.  He feels well and denies specific complaints.    Past Medical History:  Diagnosis Date   Anginal pain ([place])    Complication of anesthesia    Coronary artery disease    Dental bridge present    top   Diabetes mellitus without complication (HCC)    GERD (gastroesophageal reflux disease)    Hypertension    Lipids blood increased    Motion sickness    deep sea fishing   PONV (postoperative nausea and vomiting)    Wears  dentures    partial lower    Past Surgical History:  Procedure Laterality Date   BACK SURGERY     CARDIAC CATHETERIZATION     CARDIAC CATHETERIZATION N/A 09/11/2014   Procedure: Left Heart Cath;  Surgeon: Isaias Cowman, MD;  Location: [place] [facility];  Service: Cardiovascular;  Laterality: N/A;   CARDIAC CATHETERIZATION N/A 09/11/2014   Procedure: Coronary Stent Intervention;  Surgeon: Isaias Cowman, MD;  Location: [place] [facility];  Service: Cardiovascular;  Laterality: N/A;   CATARACT EXTRACTION W/PHACO Left 01/31/2019   Procedure: CATARACT EXTRACTION PHACO AND INTRAOCULAR LENS PLACEMENT (IOC) LEFT DIABETIC  01:27.0  16.4%  14.29;  Surgeon: Leandrew Koyanagi, MD;  Location: [facility];  Service: Ophthalmology;  Laterality: Left;  Diabetic - oral meds   CATARACT EXTRACTION W/PHACO Right 02/28/2019   Procedure: CATARACT EXTRACTION PHACO AND INTRAOCULAR LENS PLACEMENT (IOC) RIGHT DIABETIC 00:58.5  16.5%  9.66;  Surgeon: Leandrew Koyanagi, MD;  Location: [place];  Service: Ophthalmology;  Laterality: Right;  Diabetic - oral meds   COLONOSCOPY     COLONOSCOPY WITH PROPOFOL N/A 09/29/2015   Procedure: COLONOSCOPY WITH PROPOFOL;  Surgeon: Manya Silvas, MD;  Location: [facility] ENDOSCOPY;  Service: Endoscopy;  Laterality: N/A;   CORONARY ANGIOPLASTY     NASAL SINUS SURGERY     TONSILLECTOMY      No family history on file.  Social History:  reports that he has never smoked. He has never used smokeless tobacco. He reports that  he does not drink alcohol and does not use drugs. He does not smoke. He has not drank any alcohol since he was diagnosed with diabetes in 1992. He used to be in [employer]. He was exposed to a "radiation room" in the [employer]. He is a retired Chief Financial Officer. His wife passed away from breast cancer in 2013.   Allergies:  Allergies  Allergen Reactions   Atenolol Shortness Of Breath   Cardura [Doxazosin] Other (See Comments)    Renal  issues   Ciprofloxacin     hypoglycemia   Fenofibrate Itching   Glucophage [Metformin Hcl] Other (See Comments)    flatua   Hydrochlorothiazide Other (See Comments)   Lipitor [Atorvastatin] Other (See Comments)    aching   Metoprolol    Niacin Other (See Comments)   Other     (d)-limonene Flavor   Pioglitazone Dermatitis   Pravastatin Other (See Comments)    hyperglycemia   Rosuvastatin    Statins    Vitamin D Analogs    Zetia [Ezetimibe]     Current Medications: Current Outpatient Medications  Medication Sig Dispense Refill   aspirin 81 MG chewable tablet Chew by mouth daily.     clopidogrel (PLAVIX) 75 MG tablet Take 1 tablet (75 mg total) by mouth daily with breakfast. 30 tablet 6   diclofenac Sodium (VOLTAREN) 1 % GEL Apply 2 g topically 4 (four) times daily as needed. 100 g 0   empagliflozin (JARDIANCE) 25 MG TABS tablet Take by mouth daily. Patient takes 0.5 tablet a day (12.5 mg total)     famotidine (PEPCID) 20 MG tablet Take 20 mg by mouth 2 (two) times daily as needed.      glipiZIDE (GLUCOTROL) 10 MG tablet Take 20 mg by mouth 2 (two) times daily before a meal.      glucose blood (PRECISION QID TEST) test strip Use 1 each (1 strip total) 3 (three) times daily One Touch Ultra.Use as instructed.     lisinopril (PRINIVIL,ZESTRIL) 40 MG tablet Take 40 mg by mouth daily.     OneTouch Delica Lancets 88L MISC 50 each by Other route once daily     SENNA PO Take by mouth as needed.     sildenafil (REVATIO) 20 MG tablet Take 100 mg by mouth daily as needed.     No current facility-administered medications for this visit.    Review of Systems  Constitutional:  Negative for chills, fever, malaise/fatigue and weight loss.  HENT:  Negative for hearing loss, nosebleeds, sore throat and tinnitus.   Eyes:  Negative for blurred vision and double vision.  Respiratory:  Negative for cough, hemoptysis, shortness of breath and wheezing.   Cardiovascular:  Negative for chest pain,  palpitations and leg swelling.  Gastrointestinal:  Negative for abdominal pain, blood in stool, constipation, diarrhea, melena, nausea and vomiting.  Genitourinary:  Negative for dysuria and urgency.  Musculoskeletal:  Negative for back pain, falls, joint pain and myalgias.  Skin:  Negative for itching and rash.  Neurological:  Negative for dizziness, tingling, sensory change, loss of consciousness, weakness and headaches.  Endo/Heme/Allergies:  Negative for environmental allergies. Does not bruise/bleed easily.  Psychiatric/Behavioral:  Negative for depression. The patient is not nervous/anxious and does not have insomnia.    Performance status (ECOG): 0  Vitals Blood pressure (!) 147/66, pulse 86, temperature 98.6 F (37 C), resp. rate 20, weight 160 lb 8 oz (72.8 kg), SpO2 100 %.   Physical Exam Vitals reviewed.  Constitutional:  General: He is not in acute distress.    Appearance: He is not diaphoretic.  Eyes:     General: No scleral icterus. Cardiovascular:     Rate and Rhythm: Normal rate and regular rhythm.  Pulmonary:     Effort: No respiratory distress.  Neurological:     Mental Status: He is alert and oriented to person, place, and time.  Psychiatric:        Behavior: Behavior normal.        Thought Content: Thought content normal.        Judgment: Judgment normal.    Appointment on 05/14/2021  Component Date Value Ref Range Status   WBC 05/14/2021 6.6  4.0 - 10.5 K/uL Final   RBC 05/14/2021 5.32  4.22 - 5.81 MIL/uL Final   Hemoglobin 05/14/2021 15.2  13.0 - 17.0 g/dL Final   HCT 05/14/2021 45.4  39.0 - 52.0 % Final   MCV 05/14/2021 85.3  80.0 - 100.0 fL Final   MCH 05/14/2021 28.6  26.0 - 34.0 pg Final   MCHC 05/14/2021 33.5  30.0 - 36.0 g/dL Final   RDW 05/14/2021 13.8  11.5 - 15.5 % Final   Platelets 05/14/2021 170  150 - 400 K/uL Final   nRBC 05/14/2021 0.0  0.0 - 0.2 % Final   Neutrophils Relative % 05/14/2021 63  % Final   Neutro Abs 05/14/2021 4.2   1.7 - 7.7 K/uL Final   Lymphocytes Relative 05/14/2021 20  % Final   Lymphs Abs 05/14/2021 1.4  0.7 - 4.0 K/uL Final   Monocytes Relative 05/14/2021 12  % Final   Monocytes Absolute 05/14/2021 0.8  0.1 - 1.0 K/uL Final   Eosinophils Relative 05/14/2021 4  % Final   Eosinophils Absolute 05/14/2021 0.2  0.0 - 0.5 K/uL Final   Basophils Relative 05/14/2021 1  % Final   Basophils Absolute 05/14/2021 0.1  0.0 - 0.1 K/uL Final   Immature Granulocytes 05/14/2021 0  % Final   Abs Immature Granulocytes 05/14/2021 0.02  0.00 - 0.07 K/uL Final   Performed at [facility], [address] [place], [place] [address]    Assessment & Plan: Isaac Ruiz is a 81 y.o. male   1.  Monoclonal gammopathy- SPEP pending at time of visit. SPEP on 03/25/2020 revealed a 0.11 gm/dL IgG monoclonal protein with lambda light chain specificity. Work-up on 04/02/2020 revealed no monoclonal protein.  Free light chain ratio was slightly elevated. Today labs reviewed. MM pnale and light chains pending at time of visit. CBC and metabolic panel reassuring with no evidence of crab criteria.He is asymptoms. Continue surveillance.   Disposition: 6 mo- lab (cbc with diff, cmp, spep, kappa lambda light chains, ldh), MD to establish care   I discussed the assessment and treatment plan with the patient.  The patient was provided an opportunity to ask questions and all were answered.  The patient agreed with the plan and demonstrated an understanding of the instructions.  The patient was advised to call back if the symptoms worsen or if the condition fails to improve as anticipated.  Beckey Rutter, DNP, AGNP-C [place] at [facility] [phone] (clinic) 05/14/2021

## 2021-05-15 LAB — KAPPA/LAMBDA LIGHT CHAINS
Kappa free light chain: 41.6 mg/L — ABNORMAL HIGH (ref 3.3–19.4)
Kappa, lambda light chain ratio: 1.84 — ABNORMAL HIGH (ref 0.26–1.65)
Lambda free light chains: 22.6 mg/L (ref 5.7–26.3)

## 2021-05-18 LAB — MULTIPLE MYELOMA PANEL, SERUM
Albumin SerPl Elph-Mcnc: 4.1 g/dL (ref 2.9–4.4)
Albumin/Glob SerPl: 1.3 (ref 0.7–1.7)
Alpha 1: 0.3 g/dL (ref 0.0–0.4)
Alpha2 Glob SerPl Elph-Mcnc: 0.8 g/dL (ref 0.4–1.0)
B-Globulin SerPl Elph-Mcnc: 1.1 g/dL (ref 0.7–1.3)
Gamma Glob SerPl Elph-Mcnc: 1.2 g/dL (ref 0.4–1.8)
Globulin, Total: 3.4 g/dL (ref 2.2–3.9)
IgA: 278 mg/dL (ref 61–437)
IgG (Immunoglobin G), Serum: 1127 mg/dL (ref 603–1613)
IgM (Immunoglobulin M), Srm: 72 mg/dL (ref 15–143)
M Protein SerPl Elph-Mcnc: 0.2 g/dL — ABNORMAL HIGH
Total Protein ELP: 7.5 g/dL (ref 6.0–8.5)

## 2021-11-18 ENCOUNTER — Inpatient Hospital Stay: Payer: Medicare HMO | Admitting: Oncology

## 2021-11-18 ENCOUNTER — Other Ambulatory Visit: Payer: Self-pay

## 2021-11-18 ENCOUNTER — Encounter: Payer: Self-pay | Admitting: Oncology

## 2021-11-18 ENCOUNTER — Inpatient Hospital Stay: Payer: Medicare HMO | Attending: Oncology

## 2021-11-18 VITALS — BP 153/75 | HR 60 | Temp 97.4°F | Resp 18 | Wt 157.1 lb

## 2021-11-18 DIAGNOSIS — D472 Monoclonal gammopathy: Secondary | ICD-10-CM

## 2021-11-18 DIAGNOSIS — R17 Unspecified jaundice: Secondary | ICD-10-CM

## 2021-11-18 LAB — CBC WITH DIFFERENTIAL/PLATELET
Abs Immature Granulocytes: 0.03 10*3/uL (ref 0.00–0.07)
Basophils Absolute: 0.1 10*3/uL (ref 0.0–0.1)
Basophils Relative: 1 %
Eosinophils Absolute: 0.1 10*3/uL (ref 0.0–0.5)
Eosinophils Relative: 1 %
HCT: 42.6 % (ref 39.0–52.0)
Hemoglobin: 14 g/dL (ref 13.0–17.0)
Immature Granulocytes: 1 %
Lymphocytes Relative: 19 %
Lymphs Abs: 1.1 10*3/uL (ref 0.7–4.0)
MCH: 28.5 pg (ref 26.0–34.0)
MCHC: 32.9 g/dL (ref 30.0–36.0)
MCV: 86.8 fL (ref 80.0–100.0)
Monocytes Absolute: 0.7 10*3/uL (ref 0.1–1.0)
Monocytes Relative: 12 %
Neutro Abs: 3.8 10*3/uL (ref 1.7–7.7)
Neutrophils Relative %: 66 %
Platelets: 159 10*3/uL (ref 150–400)
RBC: 4.91 MIL/uL (ref 4.22–5.81)
RDW: 14.7 % (ref 11.5–15.5)
WBC: 5.8 10*3/uL (ref 4.0–10.5)
nRBC: 0 % (ref 0.0–0.2)

## 2021-11-18 LAB — COMPREHENSIVE METABOLIC PANEL
ALT: 15 U/L (ref 0–44)
AST: 28 U/L (ref 15–41)
Albumin: 4 g/dL (ref 3.5–5.0)
Alkaline Phosphatase: 42 U/L (ref 38–126)
Anion gap: 7 (ref 5–15)
BUN: 21 mg/dL (ref 8–23)
CO2: 26 mmol/L (ref 22–32)
Calcium: 9 mg/dL (ref 8.9–10.3)
Chloride: 104 mmol/L (ref 98–111)
Creatinine, Ser: 1.23 mg/dL (ref 0.61–1.24)
GFR, Estimated: 59 mL/min — ABNORMAL LOW (ref >60)
Glucose, Bld: 211 mg/dL — ABNORMAL HIGH (ref 70–99)
Potassium: 4 mmol/L (ref 3.5–5.1)
Sodium: 137 mmol/L (ref 135–145)
Total Bilirubin: 1.7 mg/dL — ABNORMAL HIGH (ref 0.3–1.2)
Total Protein: 7.2 g/dL (ref 6.5–8.1)

## 2021-11-18 LAB — BILIRUBIN, DIRECT: Bilirubin, Direct: 0.2 mg/dL (ref 0.0–0.2)

## 2021-11-18 NOTE — Progress Notes (Signed)
Hematology/Oncology Progress note [phone] [phone]     Clinic Day:  11/18/2021   Referring physician: Langley Gauss Primary Ca*  ASSESSMENT & PLAN:   Assessment & Plan: MGUS (monoclonal gammopathy of unknown significance) I discussed with patient about the diagnosis of IgG MGUS which is an asymptomatic condition which has a small risk of progression to smoldering multiple myeloma and to symptomatic multiple myeloma. Less frequently, these patients progress to AL amyloidosis, light chain deposition disease, or another lymphoproliferative disorder. For now I recommend observation. Check SPEP and light chain ratio every 6 months.    Elevated bilirubin Primarily indirect bilirubinemia.   Observation.    The patient understands the plans discussed today and is in agreement with them.  He knows to contact our office if he develops concerns prior to his next appointment.  Follow-up in 6 months.  We spent sufficient time to discuss many aspect of care, questions were answered to patient's satisfaction. A total of 25 minutes was spent on this visit.  With 5 minutes spent reviewing lab results, 15 minutes counseling the patient on the diagnosis, management plan  Additional 5 minutes was spent on answering patient's questions.  Orders Placed This Encounter  Procedures   CBC with Differential/Platelet    Standing Status:   Future    Standing Expiration Date:   11/19/2022   Comprehensive metabolic panel    Standing Status:   Future    Standing Expiration Date:   11/19/2022   Kappa/lambda light chains    Standing Status:   Future    Standing Expiration Date:   11/19/2022   Multiple Myeloma Panel (SPEP&IFE w/QIG)    Standing Status:   Future    Standing Expiration Date:   11/19/2022   Bilirubin, direct    Standing Status:   Future    Number of Occurrences:   1    Standing Expiration Date:   11/19/2022    Earlie Server, MD, PhD [facility] Hematology  Oncology 11/18/2021    CHIEF COMPLAINT:  Chief complaints: Follow-up for MGUS  HISTORY OF PRESENT ILLNESS:  Patient previously followed up by Dr.Corcoran, patient switched care to me on 11/18/21 Extensive medical record review was performed by me  An IgG monoclonal gammopathy had been discovered during a work-up for weight loss Work-up revealed a hematocrit of 43.6, hemoglobin 14.9, platelets 214,000, WBC 7,200 (ANC 4600).  Creatinine was 1.11, calcium 9.5, and albumen 4.5. LDH was 150. PSA was 1.18. SPEP revealed no M-spike; immunofixation revealed an IgG monoclonal protein with lambda light chain specificity. Immunoglobulin levels were normal.  Kappa free light chains were 39.3, lambda free light chains 18.4, and ratio 2.14 (0.26-1.65).  24 hour UPEP revealed no monoclonal protein; urine kappa free light chains were 15.47, lambda free light chain 2.20, and ratio 7.03 (normal).   INTERVAL HISTORY:  Ajene presents today for follow  up. He denies fevers or chills. He denies pain. His appetite is good. His weight is decreased for 3 pounds.  Accompanied by daughter. He has no new complaints.  Denies any back pain, fever, night sweating.  REVIEW OF SYSTEMS:  Review of Systems  Constitutional:  Negative for appetite change, chills, fatigue, fever and unexpected weight change.  HENT:   Negative for hearing loss and voice change.   Eyes:  Negative for eye problems and icterus.  Respiratory:  Negative for chest tightness, cough and shortness of breath.   Cardiovascular:  Negative for chest pain and leg swelling.  Gastrointestinal:  Negative  for abdominal distention and abdominal pain.  Endocrine: Negative for hot flashes.  Genitourinary:  Negative for difficulty urinating, dysuria and frequency.   Musculoskeletal:  Negative for arthralgias.  Skin:  Negative for itching and rash.  Neurological:  Negative for light-headedness and numbness.  Hematological:  Negative for adenopathy. Does not  bruise/bleed easily.  Psychiatric/Behavioral:  Negative for confusion.      VITALS:  Blood pressure (!) 153/75, pulse 60, temperature (!) 97.4 F (36.3 C), resp. rate 18, weight 157 lb 1.6 oz (71.3 kg).  Wt Readings from Last 3 Encounters:  11/18/21 157 lb 1.6 oz (71.3 kg)  05/14/21 160 lb 8 oz (72.8 kg)  09/08/20 158 lb (71.7 kg)    Body mass index is 23.2 kg/m.  Performance status (ECOG): 1 - Symptomatic but completely ambulatory  PHYSICAL EXAM:  Physical Exam Constitutional:      General: He is not in acute distress.    Appearance: He is obese. He is not diaphoretic.  HENT:     Head: Normocephalic and atraumatic.     Nose: Nose normal.     Mouth/Throat:     Pharynx: No oropharyngeal exudate.  Eyes:     General: No scleral icterus.    Pupils: Pupils are equal, round, and reactive to light.  Cardiovascular:     Rate and Rhythm: Normal rate and regular rhythm.     Heart sounds: No murmur heard. Pulmonary:     Effort: Pulmonary effort is normal. No respiratory distress.     Breath sounds: No rales.  Chest:     Chest wall: No tenderness.  Abdominal:     General: There is no distension.     Palpations: Abdomen is soft.     Tenderness: There is no abdominal tenderness.  Musculoskeletal:        General: Normal range of motion.     Cervical back: Normal range of motion and neck supple.  Skin:    General: Skin is warm and dry.     Findings: No erythema.  Neurological:     Mental Status: He is alert and oriented to person, place, and time.     Cranial Nerves: No cranial nerve deficit.     Motor: No abnormal muscle tone.     Coordination: Coordination normal.  Psychiatric:        Mood and Affect: Affect normal.      LABS:      Latest Ref Rng & Units 11/18/2021   10:02 AM 05/14/2021    9:58 AM 10/22/2020   10:23 AM  CBC  WBC 4.0 - 10.5 K/uL 5.8  6.6  6.2   Hemoglobin 13.0 - 17.0 g/dL 14.0  15.2  14.5   Hematocrit 39.0 - 52.0 % 42.6  45.4  42.3   Platelets 150 -  400 K/uL 159  170  182       Latest Ref Rng & Units 11/18/2021   10:02 AM 05/14/2021    9:58 AM 10/22/2020   10:23 AM  CMP  Glucose 70 - 99 mg/dL 211  164  228   BUN 8 - 23 mg/dL 21  17  22    Creatinine 0.61 - 1.24 mg/dL 1.23  1.11  1.10   Sodium 135 - 145 mmol/L 137  135  136   Potassium 3.5 - 5.1 mmol/L 4.0  4.3  4.0   Chloride 98 - 111 mmol/L 104  100  104   CO2 22 - 32 mmol/L 26  26  26   Calcium 8.9 - 10.3 mg/dL 9.0  9.7  9.2   Total Protein 6.5 - 8.1 g/dL 7.2  8.2  7.6   Total Bilirubin 0.3 - 1.2 mg/dL 1.7  0.7  1.2   Alkaline Phos 38 - 126 U/L 42  57  46   AST 15 - 41 U/L 28  23  22    ALT 0 - 44 U/L 15  17  14      Lab Results  Component Value Date   TOTALPROTELP 7.5 05/14/2021   ALBUMINELP 4.1 10/22/2020   A1GS 0.2 10/22/2020   A2GS 0.7 10/22/2020   BETS 1.0 10/22/2020   GAMS 0.9 10/22/2020   MSPIKE Not Observed 10/22/2020   SPEI Comment 10/22/2020   No results found for: "TIBC", "FERRITIN", "IRONPCTSAT" Lab Results  Component Value Date   LDH 152 05/14/2021   LDH 150 04/02/2020    STUDIES:  No results found.    HISTORY:   Past Medical History:  Diagnosis Date   Anginal pain ([place])    Complication of anesthesia    Coronary artery disease    Dental bridge present    top   Diabetes mellitus without complication ([place])    GERD (gastroesophageal reflux disease)    Hypertension    Lipids blood increased    Motion sickness    deep sea fishing   PONV (postoperative nausea and vomiting)    Wears dentures    partial lower    Past Surgical History:  Procedure Laterality Date   BACK SURGERY     CARDIAC CATHETERIZATION     CARDIAC CATHETERIZATION N/A 09/11/2014   Procedure: Left Heart Cath;  Surgeon: Isaias Cowman, MD;  Location: [place] [facility];  Service: Cardiovascular;  Laterality: N/A;   CARDIAC CATHETERIZATION N/A 09/11/2014   Procedure: Coronary Stent Intervention;  Surgeon: Isaias Cowman, MD;  Location: [place] [facility];  Service:  Cardiovascular;  Laterality: N/A;   CATARACT EXTRACTION W/PHACO Left 01/31/2019   Procedure: CATARACT EXTRACTION PHACO AND INTRAOCULAR LENS PLACEMENT (IOC) LEFT DIABETIC  01:27.0  16.4%  14.29;  Surgeon: Leandrew Koyanagi, MD;  Location: [place];  Service: Ophthalmology;  Laterality: Left;  Diabetic - oral meds   CATARACT EXTRACTION W/PHACO Right 02/28/2019   Procedure: CATARACT EXTRACTION PHACO AND INTRAOCULAR LENS PLACEMENT (IOC) RIGHT DIABETIC 00:58.5  16.5%  9.66;  Surgeon: Leandrew Koyanagi, MD;  Location: [place];  Service: Ophthalmology;  Laterality: Right;  Diabetic - oral meds   COLONOSCOPY     COLONOSCOPY WITH PROPOFOL N/A 09/29/2015   Procedure: COLONOSCOPY WITH PROPOFOL;  Surgeon: Manya Silvas, MD;  Location: [facility] ENDOSCOPY;  Service: Endoscopy;  Laterality: N/A;   CORONARY ANGIOPLASTY     NASAL SINUS SURGERY     TONSILLECTOMY      History reviewed. No pertinent family history.  Social History:  reports that he has never smoked. He has never used smokeless tobacco. He reports that he does not drink alcohol and does not use drugs.  Allergies:  Allergies  Allergen Reactions   Atenolol Shortness Of Breath   Cardura [Doxazosin] Other (See Comments)    Renal issues   Ciprofloxacin     hypoglycemia   Fenofibrate Itching   Glucophage [Metformin Hcl] Other (See Comments)    flatua   Hydrochlorothiazide Other (See Comments)   Lipitor [Atorvastatin] Other (See Comments)    aching   Metoprolol    Niacin Other (See Comments)   Other     (d)-limonene Flavor  Pioglitazone Dermatitis   Pravastatin Other (See Comments)    hyperglycemia   Rosuvastatin    Statins    Vitamin D Analogs    Zetia [Ezetimibe]     Current Medications: Current Outpatient Medications  Medication Sig Dispense Refill   aspirin 81 MG chewable tablet Chew by mouth daily.     clopidogrel (PLAVIX) 75 MG tablet Take 1 tablet (75 mg total) by mouth daily with breakfast. 30  tablet 6   diclofenac Sodium (VOLTAREN) 1 % GEL Apply 2 g topically 4 (four) times daily as needed. 100 g 0   empagliflozin (JARDIANCE) 25 MG TABS tablet Take by mouth daily. Patient takes 0.5 tablet a day (12.5 mg total)     famotidine (PEPCID) 20 MG tablet Take 20 mg by mouth 2 (two) times daily as needed.      glipiZIDE (GLUCOTROL) 10 MG tablet Take 20 mg by mouth 2 (two) times daily before a meal.      glucose blood (PRECISION QID TEST) test strip Use 1 each (1 strip total) 3 (three) times daily One Touch Ultra.Use as instructed.     lisinopril (PRINIVIL,ZESTRIL) 40 MG tablet Take 40 mg by mouth daily.     OneTouch Delica Lancets 86W MISC 50 each by Other route once daily     SENNA PO Take by mouth as needed.     sildenafil (REVATIO) 20 MG tablet Take 100 mg by mouth daily as needed.     No current facility-administered medications for this visit.

## 2021-11-18 NOTE — Progress Notes (Signed)
Patient here for follow up

## 2021-11-18 NOTE — Assessment & Plan Note (Signed)
Primarily indirect bilirubinemia.   Observation.

## 2021-11-18 NOTE — Assessment & Plan Note (Signed)
I discussed with patient about the diagnosis of IgG MGUS which is an asymptomatic condition which has a small risk of progression to smoldering multiple myeloma and to symptomatic multiple myeloma. Less frequently, these patients progress to AL amyloidosis, light chain deposition disease, or another lymphoproliferative disorder. For now I recommend observation. Check SPEP and light chain ratio every 6 months.

## 2021-11-19 LAB — KAPPA/LAMBDA LIGHT CHAINS
Kappa free light chain: 37.3 mg/L — ABNORMAL HIGH (ref 3.3–19.4)
Kappa, lambda light chain ratio: 1.92 — ABNORMAL HIGH (ref 0.26–1.65)
Lambda free light chains: 19.4 mg/L (ref 5.7–26.3)

## 2021-11-19 LAB — MISC LABCORP TEST (SEND OUT): Labcorp test code: 143000

## 2021-11-23 LAB — MULTIPLE MYELOMA PANEL, SERUM
Albumin SerPl Elph-Mcnc: 3.7 g/dL (ref 2.9–4.4)
Albumin/Glob SerPl: 1.3 (ref 0.7–1.7)
Alpha 1: 0.2 g/dL (ref 0.0–0.4)
Alpha2 Glob SerPl Elph-Mcnc: 0.7 g/dL (ref 0.4–1.0)
B-Globulin SerPl Elph-Mcnc: 1 g/dL (ref 0.7–1.3)
Gamma Glob SerPl Elph-Mcnc: 1 g/dL (ref 0.4–1.8)
Globulin, Total: 3 g/dL (ref 2.2–3.9)
IgA: 291 mg/dL (ref 61–437)
IgG (Immunoglobin G), Serum: 1015 mg/dL (ref 603–1613)
IgM (Immunoglobulin M), Srm: 71 mg/dL (ref 15–143)
M Protein SerPl Elph-Mcnc: 0.3 g/dL — ABNORMAL HIGH
Total Protein ELP: 6.7 g/dL (ref 6.0–8.5)

## 2021-12-01 ENCOUNTER — Telehealth: Payer: Self-pay

## 2021-12-01 NOTE — Telephone Encounter (Signed)
Patient came to clinic asking for lab results from 11/18/21. Please advise.

## 2021-12-02 NOTE — Telephone Encounter (Signed)
Per Dr. Tasia Catchings: labs look stable, no concerns and no changes to future plan. Patient informed and verbalized understanding.

## 2022-05-19 ENCOUNTER — Inpatient Hospital Stay: Payer: Medicare HMO | Attending: Oncology

## 2022-05-19 DIAGNOSIS — D472 Monoclonal gammopathy: Secondary | ICD-10-CM | POA: Diagnosis present

## 2022-05-19 DIAGNOSIS — N183 Chronic kidney disease, stage 3 unspecified: Secondary | ICD-10-CM | POA: Diagnosis not present

## 2022-05-19 LAB — COMPREHENSIVE METABOLIC PANEL
ALT: 14 U/L (ref 0–44)
AST: 22 U/L (ref 15–41)
Albumin: 4.2 g/dL (ref 3.5–5.0)
Alkaline Phosphatase: 51 U/L (ref 38–126)
Anion gap: 7 (ref 5–15)
BUN: 22 mg/dL (ref 8–23)
CO2: 26 mmol/L (ref 22–32)
Calcium: 8.8 mg/dL — ABNORMAL LOW (ref 8.9–10.3)
Chloride: 103 mmol/L (ref 98–111)
Creatinine, Ser: 1.34 mg/dL — ABNORMAL HIGH (ref 0.61–1.24)
GFR, Estimated: 53 mL/min — ABNORMAL LOW (ref >60)
Glucose, Bld: 260 mg/dL — ABNORMAL HIGH (ref 70–99)
Potassium: 3.9 mmol/L (ref 3.5–5.1)
Sodium: 136 mmol/L (ref 135–145)
Total Bilirubin: 1 mg/dL (ref 0.3–1.2)
Total Protein: 7.5 g/dL (ref 6.5–8.1)

## 2022-05-19 LAB — CBC WITH DIFFERENTIAL/PLATELET
Abs Immature Granulocytes: 0.03 10*3/uL (ref 0.00–0.07)
Basophils Absolute: 0.1 10*3/uL (ref 0.0–0.1)
Basophils Relative: 1 %
Eosinophils Absolute: 0.1 10*3/uL (ref 0.0–0.5)
Eosinophils Relative: 2 %
HCT: 43.2 % (ref 39.0–52.0)
Hemoglobin: 14.6 g/dL (ref 13.0–17.0)
Immature Granulocytes: 1 %
Lymphocytes Relative: 17 %
Lymphs Abs: 1.1 10*3/uL (ref 0.7–4.0)
MCH: 28.7 pg (ref 26.0–34.0)
MCHC: 33.8 g/dL (ref 30.0–36.0)
MCV: 85 fL (ref 80.0–100.0)
Monocytes Absolute: 0.7 10*3/uL (ref 0.1–1.0)
Monocytes Relative: 11 %
Neutro Abs: 4.2 10*3/uL (ref 1.7–7.7)
Neutrophils Relative %: 68 %
Platelets: 167 10*3/uL (ref 150–400)
RBC: 5.08 MIL/uL (ref 4.22–5.81)
RDW: 13.5 % (ref 11.5–15.5)
WBC: 6.1 10*3/uL (ref 4.0–10.5)
nRBC: 0 % (ref 0.0–0.2)

## 2022-05-20 LAB — KAPPA/LAMBDA LIGHT CHAINS
Kappa free light chain: 42.8 mg/L — ABNORMAL HIGH (ref 3.3–19.4)
Kappa, lambda light chain ratio: 2 — ABNORMAL HIGH (ref 0.26–1.65)
Lambda free light chains: 21.4 mg/L (ref 5.7–26.3)

## 2022-05-24 LAB — MULTIPLE MYELOMA PANEL, SERUM
Albumin SerPl Elph-Mcnc: 3.8 g/dL (ref 2.9–4.4)
Albumin/Glob SerPl: 1.3 (ref 0.7–1.7)
Alpha 1: 0.3 g/dL (ref 0.0–0.4)
Alpha2 Glob SerPl Elph-Mcnc: 0.7 g/dL (ref 0.4–1.0)
B-Globulin SerPl Elph-Mcnc: 1 g/dL (ref 0.7–1.3)
Gamma Glob SerPl Elph-Mcnc: 1 g/dL (ref 0.4–1.8)
Globulin, Total: 3.1 g/dL (ref 2.2–3.9)
IgA: 313 mg/dL (ref 61–437)
IgG (Immunoglobin G), Serum: 1062 mg/dL (ref 603–1613)
IgM (Immunoglobulin M), Srm: 72 mg/dL (ref 15–143)
M Protein SerPl Elph-Mcnc: 0.4 g/dL — ABNORMAL HIGH
Total Protein ELP: 6.9 g/dL (ref 6.0–8.5)

## 2022-05-26 ENCOUNTER — Ambulatory Visit
Admission: RE | Admit: 2022-05-26 | Discharge: 2022-05-26 | Disposition: A | Discharge status: Home / Self Care | Payer: Medicare HMO | Source: Ambulatory Visit | Attending: Oncology | Admitting: Oncology

## 2022-05-26 ENCOUNTER — Encounter: Payer: Self-pay | Admitting: Oncology

## 2022-05-26 ENCOUNTER — Inpatient Hospital Stay: Payer: Medicare HMO | Admitting: Oncology

## 2022-05-26 VITALS — BP 153/56 | HR 66 | Temp 96.2°F | Resp 18 | Wt 156.1 lb

## 2022-05-26 DIAGNOSIS — D472 Monoclonal gammopathy: Secondary | ICD-10-CM

## 2022-05-26 DIAGNOSIS — N183 Chronic kidney disease, stage 3 unspecified: Secondary | ICD-10-CM | POA: Insufficient documentation

## 2022-05-26 NOTE — Assessment & Plan Note (Signed)
Encourage oral hydration and avoid nephrotoxins.  Recommend optimize glycemic control

## 2022-05-26 NOTE — Progress Notes (Signed)
Pt here for follow up. Pt reports that he had a fall and he heard a pop in groin are.

## 2022-05-26 NOTE — Progress Notes (Signed)
Hematology/Oncology Progress note [phone] [phone]    Chief complaints: Follow-up for MGUS  Assessment & Plan:  MGUS (monoclonal gammopathy of unknown significance) Labs are reviewed and discussed with patient. Slight increase of M protein and light chain ratio.  Will obtain skeletal survey. Continue monitor    CKD (chronic kidney disease) stage 3, GFR 30-59 ml/min (HCC) Encourage oral hydration and avoid nephrotoxins.  Recommend optimize glycemic control    The patient understands the plans discussed today and is in agreement with them.  He knows to contact our office if he develops concerns prior to his next appointment.  Follow-up in 6 months.  Orders Placed This Encounter  Procedures   DG Bone Survey Met    Standing Status:   Future    Number of Occurrences:   1    Standing Expiration Date:   05/27/2023    Order Specific Question:   Reason for Exam (SYMPTOM  OR DIAGNOSIS REQUIRED)    Answer:   MGUS    Order Specific Question:   Preferred imaging location?    Answer:   [facility]   CBC with Differential/Platelet    Standing Status:   Future    Standing Expiration Date:   05/26/2023   Comprehensive metabolic panel    Standing Status:   Future    Standing Expiration Date:   05/26/2023   Kappa/lambda light chains    Standing Status:   Future    Standing Expiration Date:   05/26/2023   Multiple Myeloma Panel (SPEP&IFE w/QIG)    Standing Status:   Future    Standing Expiration Date:   05/26/2023   IFE+PROTEIN ELECTRO, 24-HR UR    Standing Status:   Future    Standing Expiration Date:   05/27/2023    Earlie Server, MD, PhD [facility] Hematology Oncology 05/26/2022      HPI Patient previously followed up by Dr.Corcoran, patient switched care to me on 11/18/21 Extensive medical record review was performed by me  An IgG monoclonal gammopathy had been discovered during a work-up for weight loss Work-up revealed a hematocrit of 43.6,  hemoglobin 14.9, platelets 214,000, WBC 7,200 ([place] 4600).  Creatinine was 1.11, calcium 9.5, and albumen 4.5. LDH was 150. PSA was 1.18. SPEP revealed no M-spike; immunofixation revealed an IgG monoclonal protein with lambda light chain specificity. Immunoglobulin levels were normal.  Kappa free light chains were 39.3, lambda free light chains 18.4, and ratio 2.14 (0.26-1.65).  24 hour UPEP revealed no monoclonal protein; urine kappa free light chains were 15.47, lambda free light chain 2.20, and ratio 7.03 (normal).   INTERVAL HISTORY:  Bryn presents today for follow  up. He denies fevers or chills. He denies pain. His appetite is good. His weight is Stable   Accompanied by daughter. Denies any back pain, fever, night sweating. He had a fall 1.5 months ago, and heard a pop in his groin area.  Currently he denies any hip pain or difficulty in ambulation or bearing weight.  REVIEW OF SYSTEMS:  Review of Systems  Constitutional:  Negative for appetite change, chills, fatigue, fever and unexpected weight change.  HENT:   Negative for hearing loss and voice change.   Eyes:  Negative for eye problems and icterus.  Respiratory:  Negative for chest tightness, cough and shortness of breath.   Cardiovascular:  Negative for chest pain and leg swelling.  Gastrointestinal:  Negative for abdominal distention and abdominal pain.  Endocrine: Negative for hot flashes.  Genitourinary:  Negative for difficulty urinating, dysuria and frequency.   Musculoskeletal:  Negative for arthralgias.  Skin:  Negative for itching and rash.  Neurological:  Negative for light-headedness and numbness.  Hematological:  Negative for adenopathy. Does not bruise/bleed easily.  Psychiatric/Behavioral:  Negative for confusion.      VITALS:  Blood pressure (!) 153/56, pulse 66, temperature (!) 96.2 F (35.7 C), resp. rate 18, weight 156 lb 1.6 oz (70.8 kg).  Wt Readings from Last 3 Encounters:  05/26/22 156 lb 1.6 oz (70.8 kg)   11/18/21 157 lb 1.6 oz (71.3 kg)  05/14/21 160 lb 8 oz (72.8 kg)    Body mass index is 23.05 kg/m.  Performance status (ECOG): 1 - Symptomatic but completely ambulatory  PHYSICAL EXAM:  Physical Exam Constitutional:      Comments: Patient ambulates independently  HENT:     Head: Normocephalic.     Nose: Nose normal.     Mouth/Throat:     Pharynx: No oropharyngeal exudate.  Eyes:     General: No scleral icterus. Cardiovascular:     Rate and Rhythm: Normal rate.  Pulmonary:     Effort: Pulmonary effort is normal. No respiratory distress.     Breath sounds: No wheezing.  Abdominal:     General: There is no distension.  Musculoskeletal:        General: Normal range of motion.     Cervical back: Normal range of motion.  Skin:    General: Skin is warm and dry.     Findings: No erythema.  Neurological:     Mental Status: He is alert and oriented to person, place, and time. Mental status is at baseline.     Cranial Nerves: No cranial nerve deficit.     Motor: No abnormal muscle tone.  Psychiatric:        Mood and Affect: Mood and affect normal.      LABS:      Latest Ref Rng & Units 05/19/2022    9:59 AM 11/18/2021   10:02 AM 05/14/2021    9:58 AM  CBC  WBC 4.0 - 10.5 K/uL 6.1  5.8  6.6   Hemoglobin 13.0 - 17.0 g/dL 14.6  14.0  15.2   Hematocrit 39.0 - 52.0 % 43.2  42.6  45.4   Platelets 150 - 400 K/uL 167  159  170       Latest Ref Rng & Units 05/19/2022    9:59 AM 11/18/2021   10:02 AM 05/14/2021    9:58 AM  CMP  Glucose 70 - 99 mg/dL 260  211  164   BUN 8 - 23 mg/dL '22  21  17   '$ Creatinine 0.61 - 1.24 mg/dL 1.34  1.23  1.11   Sodium 135 - 145 mmol/L 136  137  135   Potassium 3.5 - 5.1 mmol/L 3.9  4.0  4.3   Chloride 98 - 111 mmol/L 103  104  100   CO2 22 - 32 mmol/L '26  26  26   '$ Calcium 8.9 - 10.3 mg/dL 8.8  9.0  9.7   Total Protein 6.5 - 8.1 g/dL 7.5  7.2  8.2   Total Bilirubin 0.3 - 1.2 mg/dL 1.0  1.7  0.7   Alkaline Phos 38 - 126 U/L 51  42  57   AST 15  - 41 U/L '22  28  23   '$ ALT 0 - 44 U/L '14  15  17     '$ Lab Results  Component Value Date   TOTALPROTELP 6.9 05/19/2022   ALBUMINELP 4.1 10/22/2020   A1GS 0.2 10/22/2020   A2GS 0.7 10/22/2020   BETS 1.0 10/22/2020   GAMS 0.9 10/22/2020   MSPIKE Not Observed 10/22/2020   SPEI Comment 10/22/2020   No results found for: "TIBC", "FERRITIN", "IRONPCTSAT" Lab Results  Component Value Date   LDH 152 05/14/2021   LDH 150 04/02/2020    STUDIES:  No results found.    HISTORY:   Past Medical History:  Diagnosis Date   Anginal pain ([place])    Complication of anesthesia    Coronary artery disease    Dental bridge present    top   Diabetes mellitus without complication ([place])    GERD (gastroesophageal reflux disease)    Hypertension    Lipids blood increased    Motion sickness    deep sea fishing   PONV (postoperative nausea and vomiting)    Wears dentures    partial lower    Past Surgical History:  Procedure Laterality Date   BACK SURGERY     CARDIAC CATHETERIZATION     CARDIAC CATHETERIZATION N/A 09/11/2014   Procedure: Left Heart Cath;  Surgeon: Isaias Cowman, MD;  Location: [place] [facility];  Service: Cardiovascular;  Laterality: N/A;   CARDIAC CATHETERIZATION N/A 09/11/2014   Procedure: Coronary Stent Intervention;  Surgeon: Isaias Cowman, MD;  Location: [place] [facility];  Service: Cardiovascular;  Laterality: N/A;   CATARACT EXTRACTION W/PHACO Left 01/31/2019   Procedure: CATARACT EXTRACTION PHACO AND INTRAOCULAR LENS PLACEMENT (IOC) LEFT DIABETIC  01:27.0  16.4%  14.29;  Surgeon: Leandrew Koyanagi, MD;  Location: [place];  Service: Ophthalmology;  Laterality: Left;  Diabetic - oral meds   CATARACT EXTRACTION W/PHACO Right 02/28/2019   Procedure: CATARACT EXTRACTION PHACO AND INTRAOCULAR LENS PLACEMENT (IOC) RIGHT DIABETIC 00:58.5  16.5%  9.66;  Surgeon: Leandrew Koyanagi, MD;  Location: [place];  Service: Ophthalmology;   Laterality: Right;  Diabetic - oral meds   COLONOSCOPY     COLONOSCOPY WITH PROPOFOL N/A 09/29/2015   Procedure: COLONOSCOPY WITH PROPOFOL;  Surgeon: Manya Silvas, MD;  Location: [facility];  Service: Endoscopy;  Laterality: N/A;   CORONARY ANGIOPLASTY     NASAL SINUS SURGERY     TONSILLECTOMY      History reviewed. No pertinent family history.  Social History:  reports that he has never smoked. He has never used smokeless tobacco. He reports that he does not drink alcohol and does not use drugs.  Allergies:  Allergies  Allergen Reactions   Atenolol Shortness Of Breath   Cardura [Doxazosin] Other (See Comments)    Renal issues   Ciprofloxacin     hypoglycemia   Fenofibrate Itching   Glucophage [Metformin Hcl] Other (See Comments)    flatua   Hydrochlorothiazide Other (See Comments)   Lipitor [Atorvastatin] Other (See Comments)    aching   Metoprolol    Niacin Other (See Comments)   Other     (d)-limonene Flavor   Pioglitazone Dermatitis   Pravastatin Other (See Comments)    hyperglycemia   Rosuvastatin    Statins    Vitamin D Analogs    Zetia [Ezetimibe]     Current Medications: Current Outpatient Medications  Medication Sig Dispense Refill   aspirin 81 MG chewable tablet Chew by mouth daily.     clopidogrel (PLAVIX) 75 MG tablet Take 1 tablet (75 mg total) by mouth daily with breakfast. 30 tablet 6   diclofenac  Sodium (VOLTAREN) 1 % GEL Apply 2 g topically 4 (four) times daily as needed. 100 g 0   empagliflozin (JARDIANCE) 25 MG TABS tablet Take by mouth daily. Patient takes 0.5 tablet a day (12.5 mg total)     famotidine (PEPCID) 20 MG tablet Take 20 mg by mouth 2 (two) times daily as needed.      glipiZIDE (GLUCOTROL) 10 MG tablet Take 20 mg by mouth 2 (two) times daily before a meal.      glucose blood (PRECISION QID TEST) test strip Use 1 each (1 strip total) 3 (three) times daily One Touch Ultra.Use as instructed.     lisinopril (PRINIVIL,ZESTRIL) 40 MG  tablet Take 40 mg by mouth daily.     OneTouch Delica Lancets 70D MISC 50 each by Other route once daily     SENNA PO Take by mouth as needed.     sildenafil (REVATIO) 20 MG tablet Take 100 mg by mouth daily as needed.     triamcinolone ointment (KENALOG) 0.1 % Apply 1 Application topically 2 (two) times daily.     No current facility-administered medications for this visit.

## 2022-05-26 NOTE — Assessment & Plan Note (Addendum)
Labs are reviewed and discussed with patient. Slight increase of M protein and light chain ratio.  Will obtain skeletal survey. Continue monitor

## 2022-06-22 ENCOUNTER — Other Ambulatory Visit: Payer: Self-pay | Admitting: Family Medicine

## 2022-06-22 DIAGNOSIS — R413 Other amnesia: Secondary | ICD-10-CM

## 2022-07-01 ENCOUNTER — Ambulatory Visit
Admission: RE | Admit: 2022-07-01 | Discharge: 2022-07-01 | Disposition: A | Discharge status: Home / Self Care | Payer: Medicare HMO | Source: Ambulatory Visit | Attending: Family Medicine | Admitting: Family Medicine

## 2022-07-01 DIAGNOSIS — R413 Other amnesia: Secondary | ICD-10-CM | POA: Diagnosis present

## 2022-11-22 ENCOUNTER — Other Ambulatory Visit: Payer: Medicare HMO

## 2022-12-01 ENCOUNTER — Ambulatory Visit: Payer: Medicare HMO | Admitting: Oncology

## 2023-06-10 ENCOUNTER — Ambulatory Visit: Admission: RE | Admit: 2023-06-10 | Payer: Medicare HMO | Source: Home / Self Care

## 2023-09-18 ENCOUNTER — Other Ambulatory Visit: Payer: Self-pay

## 2023-09-18 ENCOUNTER — Emergency Department

## 2023-09-18 ENCOUNTER — Emergency Department: Admission: EM | Admit: 2023-09-18 | Discharge: 2023-09-18 | Disposition: A | Discharge status: Home / Self Care

## 2023-09-18 DIAGNOSIS — E1122 Type 2 diabetes mellitus with diabetic chronic kidney disease: Secondary | ICD-10-CM | POA: Diagnosis not present

## 2023-09-18 DIAGNOSIS — N189 Chronic kidney disease, unspecified: Secondary | ICD-10-CM | POA: Diagnosis not present

## 2023-09-18 DIAGNOSIS — M25552 Pain in left hip: Secondary | ICD-10-CM | POA: Diagnosis not present

## 2023-09-18 DIAGNOSIS — R9082 White matter disease, unspecified: Secondary | ICD-10-CM | POA: Insufficient documentation

## 2023-09-18 DIAGNOSIS — Z7902 Long term (current) use of antithrombotics/antiplatelets: Secondary | ICD-10-CM | POA: Insufficient documentation

## 2023-09-18 DIAGNOSIS — S20212A Contusion of left front wall of thorax, initial encounter: Secondary | ICD-10-CM | POA: Insufficient documentation

## 2023-09-18 DIAGNOSIS — S0990XA Unspecified injury of head, initial encounter: Secondary | ICD-10-CM | POA: Diagnosis not present

## 2023-09-18 DIAGNOSIS — E1165 Type 2 diabetes mellitus with hyperglycemia: Secondary | ICD-10-CM | POA: Insufficient documentation

## 2023-09-18 DIAGNOSIS — W19XXXA Unspecified fall, initial encounter: Secondary | ICD-10-CM | POA: Diagnosis not present

## 2023-09-18 LAB — URINALYSIS, COMPLETE (UACMP) WITH MICROSCOPIC
Bacteria, UA: NONE SEEN
Bilirubin Urine: NEGATIVE
Glucose, UA: >=500 mg/dL — AB
Ketones, ur: NEGATIVE mg/dL
Leukocytes,Ua: NEGATIVE
Nitrite: NEGATIVE
Protein, ur: NEGATIVE mg/dL
RBC / HPF: 0 RBC/hpf (ref 0–5)
Specific Gravity, Urine: 1.033 — ABNORMAL HIGH (ref 1.005–1.030)
pH: 5 (ref 5.0–8.0)

## 2023-09-18 LAB — CBC WITH DIFFERENTIAL/PLATELET
Abs Immature Granulocytes: 0.05 10*3/uL (ref 0.00–0.07)
Basophils Absolute: 0.1 10*3/uL (ref 0.0–0.1)
Basophils Relative: 1 %
Eosinophils Absolute: 0.1 10*3/uL (ref 0.0–0.5)
Eosinophils Relative: 1 %
HCT: 39.7 % (ref 39.0–52.0)
Hemoglobin: 12.7 g/dL — ABNORMAL LOW (ref 13.0–17.0)
Immature Granulocytes: 1 %
Lymphocytes Relative: 15 %
Lymphs Abs: 1.1 10*3/uL (ref 0.7–4.0)
MCH: 28.5 pg (ref 26.0–34.0)
MCHC: 32 g/dL (ref 30.0–36.0)
MCV: 89.2 fL (ref 80.0–100.0)
Monocytes Absolute: 0.9 10*3/uL (ref 0.1–1.0)
Monocytes Relative: 13 %
Neutro Abs: 4.9 10*3/uL (ref 1.7–7.7)
Neutrophils Relative %: 69 %
Platelets: 160 10*3/uL (ref 150–400)
RBC: 4.45 MIL/uL (ref 4.22–5.81)
RDW: 13.9 % (ref 11.5–15.5)
WBC: 7.1 10*3/uL (ref 4.0–10.5)
nRBC: 0 % (ref 0.0–0.2)

## 2023-09-18 LAB — COMPREHENSIVE METABOLIC PANEL WITH GFR
ALT: 18 U/L (ref 0–44)
AST: 21 U/L (ref 15–41)
Albumin: 4 g/dL (ref 3.5–5.0)
Alkaline Phosphatase: 67 U/L (ref 38–126)
Anion gap: 10 (ref 5–15)
BUN: 26 mg/dL — ABNORMAL HIGH (ref 8–23)
CO2: 25 mmol/L (ref 22–32)
Calcium: 9.2 mg/dL (ref 8.9–10.3)
Chloride: 101 mmol/L (ref 98–111)
Creatinine, Ser: 1.05 mg/dL (ref 0.61–1.24)
GFR, Estimated: >60 mL/min (ref >60)
Glucose, Bld: 356 mg/dL — ABNORMAL HIGH (ref 70–99)
Potassium: 3.8 mmol/L (ref 3.5–5.1)
Sodium: 136 mmol/L (ref 135–145)
Total Bilirubin: 0.9 mg/dL (ref 0.0–1.2)
Total Protein: 6.9 g/dL (ref 6.5–8.1)

## 2023-09-18 LAB — PROTIME-INR
INR: 1 (ref 0.8–1.2)
Prothrombin Time: 13.3 s (ref 11.4–15.2)

## 2023-09-18 LAB — APTT: aPTT: 26 s (ref 24–36)

## 2023-09-18 MED ORDER — ACETAMINOPHEN 325 MG PO TABS: 650.0000 mg | ORAL_TABLET | ORAL | 2 refills | Status: DC | PRN

## 2023-09-18 NOTE — ED Triage Notes (Signed)
 Pt had an unwitnessed fall overnight. His daytime aide was concerned because he was moving slower and seemed to be in pain. Pt takes plavix .  Aide was also concerned because he had a black stool today. Pt points to L ribcage being sore. Small amount of bruising present. Pt also c/o L hip pain.

## 2023-09-18 NOTE — Discharge Instructions (Addendum)
 Your evaluation in the emergency department was reassuring, we saw no traumatic injuries from your fall.  We also saw no clear underlying medical cause of your fall.  You can use Tylenol  as needed for any ongoing discomfort.  As discussed, your blood sugar was elevated today, and you should continue to take your diabetes medications as prescribed.  Please do follow-up with your primary care doctor for reevaluation, and return to the emergency department with any new or worsening symptoms.

## 2023-09-18 NOTE — ED Provider Notes (Signed)
 [facility] Provider Note    Event Date/Time   First MD Initiated Contact with Patient 09/18/23 1755     (approximate)   History   Fall  Pt had an unwitnessed fall overnight. His daytime aide was concerned because he was moving slower and seemed to be in pain. Pt takes plavix .  Aide was also concerned because he had a black stool today. Pt points to L ribcage being sore. Small amount of bruising present. Pt also c/o L hip pain.    HPI Isaac Ruiz is a 83 y.o. male PMH CKD, MGUS, diabetes presents for evaluation after an unwitnessed fall - Patient lives at home, has 24/7 care, had a heard but unwitnessed fall at 1:53 AM.  Caretaker came immediately, no LOC was in his bedroom, believe he was trying to go to the bathroom. - No recent infectious symptoms - On Plavix , no thinners - Has been ambulatory today without difficulty - Caretaker noted that patient had a dark stool earlier today, no frank bloody stools appreciated       Physical Exam   Triage Vital Signs: ED Triage Vitals [09/18/23 1552]  Encounter Vitals Group     BP (!) 107/93     Systolic BP Percentile      Diastolic BP Percentile      Pulse Rate 72     Resp 17     Temp 98.2 F (36.8 C)     Temp Source Oral     SpO2 100 %     Weight      Height      Head Circumference      Peak Flow      Pain Score      Pain Loc      Pain Education      Exclude from Growth Chart     Most recent vital signs: Vitals:   09/18/23 1552  BP: (!) 107/93  Pulse: 72  Resp: 17  Temp: 98.2 F (36.8 C)  SpO2: 100%     General: Awake, no distress.  HEENT: Normocephalic, atraumatic Neck:  No midline ttp CV:  Good peripheral perfusion. RRR, RP 2+. Small hematoma to left anterolateral chest wall.  No associated crepitus, no gross deformity, no instability appreciated. Resp:  Normal effort. CTAB Abd:  No distention. Nontender to deep palpation throughout Neuro:  Moving all extremities  spontaneously, ambulates with cautious gait (at baseline per family) Other:  Pelvis stable. Nontender to palpation throughout bilateral upper and lower extremities.No gross deformities appreciated.   Rectal:  Moderate soft light brown stool in rectal vault, no blood/melena   ED Results / Procedures / Treatments   Labs (all labs ordered are listed, but only abnormal results are displayed) Labs Reviewed  CBC WITH DIFFERENTIAL/PLATELET - Abnormal; Notable for the following components:      Result Value   Hemoglobin 12.7 (*)    All other components within normal limits  COMPREHENSIVE METABOLIC PANEL WITH GFR - Abnormal; Notable for the following components:   Glucose, Bld 356 (*)    BUN 26 (*)    All other components within normal limits  URINALYSIS, COMPLETE (UACMP) WITH MICROSCOPIC - Abnormal; Notable for the following components:   Color, Urine STRAW (*)    APPearance CLEAR (*)    Specific Gravity, Urine 1.033 (*)    Glucose, UA >=500 (*)    Hgb urine dipstick SMALL (*)    All other components within normal limits  PROTIME-INR  APTT     EKG  Ecg = sinus rhythm, rate 66, no ST elevation or depression, no significant repolarization abnormality, normal axis, normal intervals beyond first-degree AV block.  No evidence of ischemia nor arrhythmia on my read.   RADIOLOGY Radiology reviewed and interpreted by myself with no acute pathology.  Radiology reports reviewed and in agreement.   PROCEDURES:  Critical Care performed: No  Procedures   MEDICATIONS ORDERED IN ED: Medications - No data to display   IMPRESSION / MDM / ASSESSMENT AND PLAN / ED COURSE  I reviewed the triage vital signs and the nursing notes.                              DDX/MDM/AP: Differential diagnosis includes, but is not limited to, likely mechanical fall, doubt syncopal episode given no loss of consciousness appreciated by caretaker who came bedside immediately.  Will screen for underlying UTI.   Consider intracranial hemorrhage, C-spine fracture, rib fractures, underlying pneumothorax or hemothorax, hip fracture.  Initial concern for possible GI bleed given report of dark stools however rectal exam with light brown stool, no blood or melena--not consistent with GI bleed.  Plan: - Labs - EKG - CT head, CT C-spine, x-ray pelvis/left hip, x-ray chest/left ribs   Patient's presentation is most consistent with acute presentation with potential threat to life or bodily function.  ED course below.  Workup unremarkable--no evidence of any traumatic injuries.  Hyperglycemia with no evidence of DKA, discussed with family that we could do IV fluid and recheck or he can go home and take his routine diabetes medications have been-prefer just to continue with his routine medications which I believe is very reasonable.  No evidence of underlying organic cause of fall beyond mechanical.  Rx Tylenol  for any ongoing pain as needed.  Plan for PMD follow-up.  ED return precautions in place.  Patient and family bedside agree with plan.  Clinical Course as of 09/18/23 1919  Sun Sep 18, 2023  1809 CTH, Cspine: IMPRESSION: 1. No acute intracranial pathology. Small-vessel white matter disease. 2. No fracture or static subluxation of the cervical spine. 3. 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   [MM]  1809 XR L ribs: IMPRESSION: Negative radiographs of the chest and left ribs.   [MM]  1809 CMP with hyperglycemia, anion gap normal, bicarb normal, not consistent with DKA   [MM]  1810 CBC with no leukocytosis, mild anemia when compared to 1 year ago [MM]  1810 Coags normal [MM]  1841 XR L hip + pelvis: IMPRESSION: 1. No acute fracture or subluxation of the pelvis or left hip. 2. Mild left hip osteoarthritis.   [MM]  1915 Urinalysis reviewed, no evidence of underlying infection [MM]    Clinical Course User Index [MM] Collis Deaner, MD     FINAL CLINICAL IMPRESSION(S) / ED DIAGNOSES   Final diagnoses:  Fall, initial encounter  Left hip pain  Chest wall hematoma, left, initial encounter     Rx / DC Orders   ED Discharge Orders          Ordered    acetaminophen  (TYLENOL ) 325 MG tablet  Every 4 hours PRN        09/18/23 1917             Note:  This document was prepared using Dragon voice recognition software and may include unintentional dictation errors.  Collis Deaner, MD 09/18/23 1919

## 2023-11-09 NOTE — Progress Notes (Signed)
 Telemedicine video encounter documentation  This video encounter was conducted with the patient's (or proxy's) verbal consent via secure, interactive audio and video telecommunications while in clinic/office/hospital.  The patient (or proxy) was instructed to have this encounter in a suitably private space and to only have persons present to whom they give permission to participate. In addition, patient identity was confirmed by use of name plus two identifiers.   Chief Complaint:   Chief Complaint  Patient presents with  . Form- FL2    Subjective  Isaac Ruiz is a 83 y.o. male who presents for Form- FL2 HPI  Video visit  History of Present Illness Isaac Ruiz is an 83 year old male with mixed dementia who presents for a memory care assessment and transition to a care facility. He is accompanied by his daughter, Isaac Ruiz who providers 100% of the history today.  He is currently living at home and requires assistance with daily activities such as bathing, feeding, and dressing due to his mixed dementia, which includes elements of Alzheimer's disease and other types of dementia. He experiences intermittent disorientation and may wander off. His behavior can become physical if he feels aggravated, but he does not harm himself.  He has a history of high cholesterol, diabetes, low B12 levels, mild anemia, and hypertension. His diabetes management includes Glipizide  and Jardiance, and he is on a diabetic diet to control his blood sugar levels. His blood sugar is not checked frequently at home. His last A1c was noted to be increasing in February 2025.  He experiences chronic back pain attributed to arthritis and takes Advil as needed for pain. He is also on Lamictal 100 mg twice daily for mood stabilization. He has a history of reflux but does not currently complain about it and is not taking Pepcid regularly. His bowel and bladder functions are managed with Depends, and he sometimes uses  them for bowel movements as well.  His mobility is described as good, though he is a bit shaky at times. He does not use a walker and is able to walk quickly. His vision is corrected with glasses ( but he refuses to wear them), and he does not use hearing aids. He is capable of eating finger foods and may participate in activities at the care facility. He does not have any skin breakdown or sores.   He does not have a history of seizure disorders. He does not consume alcohol and is not permitted to have it at the facility. He requires assistance with cutting food during meals.    Patient Active Problem List  Diagnosis  . Vitamin D deficiency disease  . Type 2 diabetes mellitus with hyperglycemia, without long-term current use of insulin (CMS/HHS-HCC)  . Pure hypercholesterolemia- chronic, intolerant of meds, has even seen dr. guyton  . Shoulder pain  . AC joint pain  . Shoulder joint contracture  . Essential hypertension  . H/O angioplasty  . Hyperlipidemia  . Statin intolerance  . DDD (degenerative disc disease), lumbar  . Coronary atherosclerosis of native coronary artery  . Chest pain  . S/P cardiac catheterization  . CKD stage 2 due to type 2 diabetes mellitus  (CMS/HHS-HCC)  . Heart murmur  . Anemia, mild  . IgG monoclonal gammopathy  . Mixed Alzheimer's and vascular dementia (CMS/HHS-HCC)  . Low serum vitamin B12  . Elevated bilirubin  . Encounter for screening for other disorder  . Lateral epicondylitis  . Pruritus, unspecified  . Weight loss  . Gastro-esophageal  reflux disease without esophagitis  . Long term (current) use of antithrombotics/antiplatelets    Outpatient Medications Prior to Visit  Medication Sig Dispense Refill  . ACCU-CHEK FASTCLIX LANCING DEV kit     . blood glucose diagnostic test strip 1 each (1 strip total) once daily Use as instructed. 100 each 3  . blood glucose meter kit as directed 1 each 0  . lancets Use 1 each once daily Use as  instructed. 100 each 3  . ONETOUCH [employer]     . clopidogreL  (PLAVIX ) 75 mg tablet Take 1 tablet (75 mg total) by mouth once daily 90 tablet 3  . famotidine (PEPCID) 20 MG tablet TAKE ONE TABLET BY MOUTH EVERY DAY    . glipiZIDE  (GLUCOTROL ) 10 MG tablet Take 1 tablet (10 mg total) by mouth every morning before breakfast 180 tablet 3  . ibuprofen (MOTRIN) 200 MG tablet Take 400 mg by mouth every 6 (six) hours as needed for Pain    . JARDIANCE 10 mg tablet Take 1 tablet by mouth once daily 100 tablet 2  . lamoTRIgine (LAMICTAL) 25 MG tablet Take 4 tablets (100 mg total) by mouth 2 (two) times daily 720 tablet 3  . lisinopriL  (ZESTRIL ) 40 MG tablet Take 1 tablet (40 mg total) by mouth once daily 90 tablet 3  . mirtazapine (REMERON) 7.5 MG tablet Take 2 tablets (15 mg total) by mouth at bedtime 60 tablet 11  . UNABLE TO FIND Check fsbg twice weekly and inform PCP if sugars are consistently over 250    . UNABLE TO FIND If systolic >140 or diastolic >90. If two or more readings over 140/90- please call the [facility] and report to triage. use this under a medication    . UNABLE TO FIND Check weight weekly and inform PCP if patient loses 10% of his weight in 6 month     No facility-administered medications prior to visit.     Review of Systems    Objective  Vitals as reported by patient: Vitals:   11/09/23 1003  Weight: 61.7 kg (136 lb)  Height: 175.3 cm (5' 9.02)  PainSc: 0-No pain   Body mass index is 20.07 kg/m. Physical Exam   Constitutional: in no distress Mental status: sitting quietly in his recliner, non verbal during the interview Respiratory: no respiratory distress, no audible wheezing   Results    Lab Results  Component Value Date   HGBA1C 8.0 (H) 07/04/2023   HGBA1C 7.4 (H) 04/01/2023     Assessment/Plan:   Assessment & Plan Mixed Dementia   He is experiencing mixed dementia with elements of Alzheimer's and other types. He is intermittently  disoriented, tends to wander, requires assistance with daily activities, and can become physical if aggravated. He is transitioning to a memory care facility with locked doors for safety. Staff will be informed of his tendency to wander and his need for assistance with daily activities.  Diabetes Mellitus   His diabetes mellitus shows a slight increase in A1c noted in February. Blood sugar levels require monitoring, especially with dietary changes at the memory care facility. A diabetic diet will be implemented. Blood sugar checks are ordered twice a week. A follow-up visit is scheduled in October to reassess blood sugar levels and A1c.  Hypertension   His hypertension requires regular monitoring to ensure control. Blood pressure checks are ordered once a week.  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 Previously using Advil, but due to renal concerns, Tylenol  is recommended as a safer alternative for pain management as needed.  Mild Anemia   His mild anemia is managed with iron supplementation. No additional intervention is required. We may not need the b12 and iron supplementation if his diet changes at the facility.  General Health Maintenance   Routine health maintenance includes monitoring weight and ensuring appropriate nutrition. Weight checks are ordered once a week to monitor nutritional status.  Goals of Care   The goal is to ensure his safety and well-being in the memory care facility while managing medical conditions. The family is involved in care decisions, preferring a diabetic diet. Memory care facility staff will be informed of his medical conditions and care needs, ensuring a diabetic diet and appropriate monitoring for conditions.  Follow-up   A follow-up visit is planned to reassess medical conditions, particularly diabetes management, after settling into the memory care facility. The family will provide transportation. The follow-up  visit is scheduled in October to reassess blood sugar levels and A1c, with transportation arrangements ensured.   Diagnoses and all orders for this visit:  Mixed Alzheimer's and vascular dementia (CMS/HHS-HCC)  Type 2 diabetes mellitus with hyperglycemia, without long-term current use of insulin (CMS/HHS-HCC) -     empagliflozin (JARDIANCE) 10 mg tablet; Take 1 tablet (10 mg total) by mouth once daily  Essential hypertension -     lisinopriL  (ZESTRIL ) 40 MG tablet; Take 1 tablet (40 mg total) by mouth once daily  Other orders -     clopidogreL  (PLAVIX ) 75 mg tablet; Take 1 tablet (75 mg total) by mouth once daily -     glipiZIDE  (GLUCOTROL ) 10 MG tablet; Take 1 tablet (10 mg total) by mouth every morning before breakfast -     lamoTRIgine (LAMICTAL) 25 MG tablet; Take 4 tablets (100 mg total) by mouth 2 (two) times daily -     mirtazapine (REMERON) 7.5 MG tablet; Take 2 tablets (15 mg total) by mouth at bedtime -     ferrous sulfate 325 (65 FE) MG EC tablet; Take 1 tablet (325 mg total) by mouth 3 (three) times a week -     acetaminophen  (TYLENOL ) 500 MG tablet; Take 2 tablets (1,000 mg total) by mouth every 8 (eight) hours as needed for Pain for up to 10 days          This visit was coded based on medical decision making (MDM).           Future Appointments     Date/Time Provider Department Center Visit Type   01/04/2024 11:00 AM Maree Jannett Hering, MD [facility] C RETURN VISIT   02/01/2024 11:00 AM Paraschos, Marsa, MD [facility] C FOLLOW UP   02/09/2024 10:30 AM (Arrive by 10:15 AM) Jyl Heron Haff, MD [facility] [place] [facility] OFFICE VISIT   07/09/2024 11:00 AM (Arrive by 10:45 AM) POPULATION HEALTH NURSE - [facility] [place] [facility] WELLNESS   07/09/2024 11:30 AM (Arrive by 11:15 AM) Jyl Heron Haff, MD [facility] [place] [facility] OFFICE VISIT       There are no  Patient Instructions on file for this visit.  An after visit summary was provided for the patient either in written format (printed) or through [facility].  This note has been created using automated tools and reviewed for accuracy by BARBARA D ALDRIDGE.

## 2023-11-11 ENCOUNTER — Emergency Department

## 2023-11-11 ENCOUNTER — Emergency Department
Admission: EM | Admit: 2023-11-11 | Discharge: 2023-11-11 | Disposition: A | Discharge status: Home / Self Care | Source: Skilled Nursing Facility | Attending: Emergency Medicine | Admitting: Emergency Medicine

## 2023-11-11 ENCOUNTER — Other Ambulatory Visit: Payer: Self-pay

## 2023-11-11 DIAGNOSIS — F0390 Unspecified dementia without behavioral disturbance: Secondary | ICD-10-CM | POA: Insufficient documentation

## 2023-11-11 DIAGNOSIS — N189 Chronic kidney disease, unspecified: Secondary | ICD-10-CM | POA: Diagnosis not present

## 2023-11-11 DIAGNOSIS — Z7902 Long term (current) use of antithrombotics/antiplatelets: Secondary | ICD-10-CM | POA: Insufficient documentation

## 2023-11-11 DIAGNOSIS — E1122 Type 2 diabetes mellitus with diabetic chronic kidney disease: Secondary | ICD-10-CM | POA: Insufficient documentation

## 2023-11-11 DIAGNOSIS — S0091XA Abrasion of unspecified part of head, initial encounter: Secondary | ICD-10-CM | POA: Insufficient documentation

## 2023-11-11 DIAGNOSIS — W19XXXA Unspecified fall, initial encounter: Secondary | ICD-10-CM

## 2023-11-11 DIAGNOSIS — W010XXA Fall on same level from slipping, tripping and stumbling without subsequent striking against object, initial encounter: Secondary | ICD-10-CM | POA: Diagnosis not present

## 2023-11-11 DIAGNOSIS — S0990XA Unspecified injury of head, initial encounter: Secondary | ICD-10-CM | POA: Diagnosis present

## 2023-11-11 LAB — COMPREHENSIVE METABOLIC PANEL WITH GFR
ALT: 22 U/L (ref 0–44)
AST: 38 U/L (ref 15–41)
Albumin: 4.5 g/dL (ref 3.5–5.0)
Alkaline Phosphatase: 63 U/L (ref 38–126)
Anion gap: 10 (ref 5–15)
BUN: 38 mg/dL — ABNORMAL HIGH (ref 8–23)
CO2: 26 mmol/L (ref 22–32)
Calcium: 9.5 mg/dL (ref 8.9–10.3)
Chloride: 104 mmol/L (ref 98–111)
Creatinine, Ser: 1.1 mg/dL (ref 0.61–1.24)
GFR, Estimated: >60 mL/min (ref >60)
Glucose, Bld: 116 mg/dL — ABNORMAL HIGH (ref 70–99)
Potassium: 4.6 mmol/L (ref 3.5–5.1)
Sodium: 140 mmol/L (ref 135–145)
Total Bilirubin: 1.1 mg/dL (ref 0.0–1.2)
Total Protein: 7.9 g/dL (ref 6.5–8.1)

## 2023-11-11 LAB — CBC WITH DIFFERENTIAL/PLATELET
Abs Immature Granulocytes: 0.04 K/uL (ref 0.00–0.07)
Basophils Absolute: 0.1 K/uL (ref 0.0–0.1)
Basophils Relative: 1 %
Eosinophils Absolute: 0.1 K/uL (ref 0.0–0.5)
Eosinophils Relative: 1 %
HCT: 39.1 % (ref 39.0–52.0)
Hemoglobin: 12.6 g/dL — ABNORMAL LOW (ref 13.0–17.0)
Immature Granulocytes: 1 %
Lymphocytes Relative: 17 %
Lymphs Abs: 1.2 K/uL (ref 0.7–4.0)
MCH: 29 pg (ref 26.0–34.0)
MCHC: 32.2 g/dL (ref 30.0–36.0)
MCV: 89.9 fL (ref 80.0–100.0)
Monocytes Absolute: 0.9 K/uL (ref 0.1–1.0)
Monocytes Relative: 13 %
Neutro Abs: 4.6 K/uL (ref 1.7–7.7)
Neutrophils Relative %: 67 %
Platelets: 168 K/uL (ref 150–400)
RBC: 4.35 MIL/uL (ref 4.22–5.81)
RDW: 14.4 % (ref 11.5–15.5)
WBC: 6.8 K/uL (ref 4.0–10.5)
nRBC: 0 % (ref 0.0–0.2)

## 2023-11-11 NOTE — ED Notes (Signed)
 Pt is from [facility]

## 2023-11-11 NOTE — ED Notes (Signed)
 Patient's family reports they were informed patient got tangled up in a blanket and fell. Patient c/o posterior head pain.

## 2023-11-11 NOTE — Discharge Instructions (Signed)
 The CT of the head and cervical spine are negative.  Return to the ER for new, worsening, or persistent severe headache, back pain, dizziness, vomiting, change in mental status, or any other new or worsening symptoms that are concerning.

## 2023-11-11 NOTE — ED Triage Notes (Signed)
 Pt found lying on floor by staff after unwitnessed fall. Pt is on the dementia unit. Pt with bruises to back of head and arms. Skin tears to multiple areas. Unsure of LOC. Pt axox1.

## 2023-11-11 NOTE — ED Provider Notes (Signed)
 [facility] Provider Note    Event Date/Time   First MD Initiated Contact with Patient 11/11/23 2114     (approximate)   History   Fall   HPI  Isaac Ruiz is a 83 y.o. male with a history of CKD, MGUS, and diabetes who presents after a fall.  Per the family, the patient had apparently wrapped himself up in his blanket and tried to walk.  The patient states that he believes he slipped and fell.  He denies any acute pain except for on the back of his head, where he has an abrasion from a prior fall.  He denies any other symptoms at this time.  I reviewed the past medical records.  The patient's most recent outpatient encounter was with telemedicine on 7/2 for memory care assessment and transition to a care facility.   Physical Exam   Triage Vital Signs: ED Triage Vitals  Encounter Vitals Group     BP 11/11/23 2030 (!) 164/79     Girls Systolic BP Percentile --      Girls Diastolic BP Percentile --      Boys Systolic BP Percentile --      Boys Diastolic BP Percentile --      Pulse Rate 11/11/23 2030 74     Resp 11/11/23 2030 17     Temp 11/11/23 2030 98.4 F (36.9 C)     Temp Source 11/11/23 2030 Oral     SpO2 11/11/23 2030 100 %     Weight --      Height 11/11/23 2031 5' 9 (1.753 m)     Head Circumference --      Peak Flow --      Pain Score --      Pain Loc --      Pain Education --      Exclude from Growth Chart --     Most recent vital signs: Vitals:   11/11/23 2030 11/11/23 2226  BP: (!) 164/79 (!) 156/76  Pulse: 74 73  Resp: 17 18  Temp: 98.4 F (36.9 C) 98.1 F (36.7 C)  SpO2: 100% 95%     General: Alert, oriented x 2, no distress.  CV:  Good peripheral perfusion.  Resp:  Normal effort.  Abd:  Soft and nontender.  No distention.  Other:  EOMI.  PERRLA.  No facial droop.  Normal speech.  Motor intact in all extremities.  No midline spinal tenderness.   ED Results / Procedures / Treatments   Labs (all labs ordered  are listed, but only abnormal results are displayed) Labs Reviewed  CBC WITH DIFFERENTIAL/PLATELET - Abnormal; Notable for the following components:      Result Value   Hemoglobin 12.6 (*)    All other components within normal limits  COMPREHENSIVE METABOLIC PANEL WITH GFR - Abnormal; Notable for the following components:   Glucose, Bld 116 (*)    BUN 38 (*)    All other components within normal limits     EKG    RADIOLOGY  CT head/cervical spine: I independently viewed and interpreted the images; there is no ICH.  Radiology report indicates no acute traumatic findings   PROCEDURES:  Critical Care performed: No  Procedures   MEDICATIONS ORDERED IN ED: Medications - No data to display   IMPRESSION / MDM / ASSESSMENT AND PLAN / ED COURSE  I reviewed the triage vital signs and the nursing notes.  83 year old male with PMH as noted above  presents after mechanical fall from standing height.  He is on Plavix  but no anticoagulation.  On exam his vital signs are normal except for hypertension.  Neurologic exam is nonfocal.  He denies any acute pain.  There are no wounds which require repair.  Differential diagnosis includes, but is not limited to, minor head injury, concussion, TBI.  The patient has no midline spinal tenderness.  There is no indication of near-syncope or syncope.  Although he has dementia, the patient seems to clearly remember the fall as mechanical.  CT head and cervical spine were obtained from triage and are negative.  CMP and CBC were also obtained and show no acute findings.  Patient's presentation is most consistent with acute presentation with potential threat to life or bodily function.  The patient is on the cardiac monitor to evaluate for evidence of arrhythmia and/or significant heart rate changes.  At this time, based on the negative imaging and workup, the patient is stable for discharge back to his facility.SABRA  He feels well.  Family agrees with the  plan.  Return precautions provided, and he expressed understanding.     FINAL CLINICAL IMPRESSION(S) / ED DIAGNOSES   Final diagnoses:  Fall, initial encounter     Rx / DC Orders   ED Discharge Orders     None        Note:  This document was prepared using Dragon voice recognition software and may include unintentional dictation errors.    Jacolyn Pae, MD 11/11/23 2250

## 2023-11-11 NOTE — ED Notes (Signed)
 RN to bedside for assessment/rounding. Patient in imaging.

## 2023-11-13 ENCOUNTER — Emergency Department
Admission: EM | Admit: 2023-11-13 | Discharge: 2023-11-13 | Disposition: A | Discharge status: Skilled Nursing Facility | Attending: Emergency Medicine | Admitting: Emergency Medicine

## 2023-11-13 ENCOUNTER — Emergency Department

## 2023-11-13 ENCOUNTER — Other Ambulatory Visit: Payer: Self-pay

## 2023-11-13 DIAGNOSIS — S0990XA Unspecified injury of head, initial encounter: Secondary | ICD-10-CM

## 2023-11-13 DIAGNOSIS — W19XXXA Unspecified fall, initial encounter: Secondary | ICD-10-CM | POA: Diagnosis not present

## 2023-11-13 DIAGNOSIS — T148XXA Other injury of unspecified body region, initial encounter: Secondary | ICD-10-CM

## 2023-11-13 DIAGNOSIS — S0083XA Contusion of other part of head, initial encounter: Secondary | ICD-10-CM | POA: Insufficient documentation

## 2023-11-13 LAB — URINALYSIS, ROUTINE W REFLEX MICROSCOPIC
Bacteria, UA: NONE SEEN
Bilirubin Urine: NEGATIVE
Glucose, UA: >=500 mg/dL — AB
Ketones, ur: 5 mg/dL — AB
Leukocytes,Ua: NEGATIVE
Nitrite: NEGATIVE
Protein, ur: NEGATIVE mg/dL
Specific Gravity, Urine: 1.031 — ABNORMAL HIGH (ref 1.005–1.030)
Squamous Epithelial / HPF: 0 /HPF (ref 0–5)
pH: 5 (ref 5.0–8.0)

## 2023-11-13 NOTE — ED Notes (Signed)
 Checked pt brief at this time. Pt brief remains dry.

## 2023-11-13 NOTE — ED Notes (Signed)
 Pt brief soiled at this time. Brief changed. Chux pad placed.

## 2023-11-13 NOTE — ED Provider Notes (Signed)
 [facility] Provider Note    Event Date/Time   First MD Initiated Contact with Patient 11/13/23 1040     (approximate)   History   Fall   HPI  Isaac Ruiz is a 83 y.o. male who presents to the emergency department today from nursing home after an unwitnessed fall.  Staff did notice hematoma on his right forehead.  Patient unable to give any history.  Per chart review patient was seen 2 days ago after a fall and had blood work performed at that time. Fa hadmily at bedside state they are not aware of any recent fevers.      Physical Exam   Triage Vital Signs: ED Triage Vitals  Encounter Vitals Group     BP 11/13/23 1046 121/62     Girls Systolic BP Percentile --      Girls Diastolic BP Percentile --      Boys Systolic BP Percentile --      Boys Diastolic BP Percentile --      Pulse Rate 11/13/23 1044 73     Resp 11/13/23 1044 20     Temp 11/13/23 1046 98.4 F (36.9 C)     Temp Source 11/13/23 1046 Axillary     SpO2 11/13/23 1044 100 %     Weight 11/13/23 1042 128 lb 14.4 oz (58.5 kg)     Height 11/13/23 1042 5' 9 (1.753 m)     Head Circumference --      Peak Flow --      Pain Score --      Pain Loc --      Pain Education --      Exclude from Growth Chart --     Most recent vital signs: Vitals:   11/13/23 1046 11/13/23 1046  BP: 121/62   Pulse:    Resp:    Temp:  98.4 F (36.9 C)  SpO2:     General: Awake. CV:  Good peripheral perfusion. Regular rate and rhythm. Resp:  Normal effort. Lungs clear. Abd:  No distention.  Other:  Hematoma to right forehead   ED Results / Procedures / Treatments   Labs (all labs ordered are listed, but only abnormal results are displayed) Labs Reviewed  URINALYSIS, ROUTINE W REFLEX MICROSCOPIC - Abnormal; Notable for the following components:      Result Value   Color, Urine YELLOW (*)    APPearance CLEAR (*)    Specific Gravity, Urine 1.031 (*)    Glucose, UA >=500 (*)    Hgb urine  dipstick MODERATE (*)    Ketones, ur 5 (*)    All other components within normal limits     EKG  I, Guadalupe Eagles, attending physician, personally viewed and interpreted this EKG  EKG Time: 1046 Rate: 75 Rhythm: sinus rhythm Axis: normal Intervals: qtc 448 QRS: narrow ST changes: q waves v1 Impression: abnormal ekg    RADIOLOGY I independently interpreted and visualized the CT head/cervical spine. My interpretation: No ICH, no fracture Radiology interpretation:  IMPRESSION:  HEAD CT:    1. No acute intracranial findings.  2. New scalp hematoma over the RIGHT frontal bone.  3. Atrophy and white matter microvascular disease.    CERVICAL SPINE CT:    1. No cervical spine fracture.  2. No traumatic subluxation.  3. Cervical spondylosis.      PROCEDURES:  Critical Care performed: No   MEDICATIONS ORDERED IN ED: Medications - No data to display  IMPRESSION / MDM / ASSESSMENT AND PLAN / ED COURSE  I reviewed the triage vital signs and the nursing notes.                              Differential diagnosis includes, but is not limited to, ICH, fracture, infection  Patient's presentation is most consistent with acute presentation with potential threat to life or bodily function.  Patient was to the emergency department today from living facility because of concerns for fall and head injury.  Patient was seen in the ER 2 days ago after a fall.  He is new to this living facility.  Had blood work performed 2 days ago.  No significant abnormalities.  I did discuss with family.  Given recent blood work they felt comfortable deferring blood work today.  Did check a urine which did not show signs of infection.  Head CT shows a scalp hematoma but no ICH or fracture.  Cervical spine without fracture.  Will plan on discharging back to facility.     FINAL CLINICAL IMPRESSION(S) / ED DIAGNOSES   Final diagnoses:  Fall, initial encounter  Injury of head, initial  encounter  Hematoma      Note:  This document was prepared using Dragon voice recognition software and may include unintentional dictation errors.    Floy Roberts, MD 11/13/23 1330

## 2023-11-13 NOTE — ED Triage Notes (Signed)
 pt in via [facility] from [facility]. Pt had a fall unwitnessed. Pt was seen here 2 days ago for the same. Per staff pt has been having multiple falls. No blood thinners.   BS-168 T-97.6 BP- 152/66 HR- 77 O2-99%

## 2023-11-13 NOTE — ED Notes (Signed)
 [facility] called for update, eta 5:30pm

## 2023-11-15 ENCOUNTER — Emergency Department
Admission: EM | Admit: 2023-11-15 | Discharge: 2023-11-16 | Disposition: A | Discharge status: Other Acute Inpatient Hospital | Attending: Emergency Medicine | Admitting: Emergency Medicine

## 2023-11-15 ENCOUNTER — Emergency Department

## 2023-11-15 ENCOUNTER — Other Ambulatory Visit: Payer: Self-pay

## 2023-11-15 ENCOUNTER — Encounter: Payer: Self-pay | Admitting: *Deleted

## 2023-11-15 DIAGNOSIS — S0083XA Contusion of other part of head, initial encounter: Secondary | ICD-10-CM | POA: Insufficient documentation

## 2023-11-15 DIAGNOSIS — S0990XA Unspecified injury of head, initial encounter: Secondary | ICD-10-CM | POA: Diagnosis present

## 2023-11-15 DIAGNOSIS — W01198A Fall on same level from slipping, tripping and stumbling with subsequent striking against other object, initial encounter: Secondary | ICD-10-CM | POA: Diagnosis not present

## 2023-11-15 DIAGNOSIS — F0390 Unspecified dementia without behavioral disturbance: Secondary | ICD-10-CM | POA: Insufficient documentation

## 2023-11-15 DIAGNOSIS — W19XXXA Unspecified fall, initial encounter: Secondary | ICD-10-CM

## 2023-11-15 NOTE — ED Triage Notes (Signed)
 Pt brought in via [facility] from [facility].  Pt had unwitnessed fall in the dayroom.  Pt has a hematoma to right side of forehead.  Pt has bruising beside right eye.  No blood thinners. Hx dementia.

## 2023-11-16 NOTE — ED Provider Notes (Signed)
 [facility] Provider Note    Event Date/Time   First MD Initiated Contact with Patient 11/15/23 2340     (approximate)   History   Fall   HPI  Isaac Ruiz is a 83 y.o. male   Past medical history of dementia, frequent falls, hypertension who presents emergency department from his advanced facility with a fall.  Apparently he was reaching down to grab something for his daughter's report and fell forward hitting his head.  He has had frequent falls lately.  He is on Plavix .  He did not lose consciousness.  He was able to walk afterwards.  He has no complaints at this time.  Independent Historian contributed to assessment above: Daughter gives corroborating information as above       Physical Exam   Triage Vital Signs: ED Triage Vitals  Encounter Vitals Group     BP 11/15/23 2052 (!) 100/52     Girls Systolic BP Percentile --      Girls Diastolic BP Percentile --      Boys Systolic BP Percentile --      Boys Diastolic BP Percentile --      Pulse Rate 11/15/23 2052 78     Resp 11/15/23 2052 18     Temp 11/15/23 2052 99 F (37.2 C)     Temp Source 11/15/23 2052 Oral     SpO2 11/15/23 2052 100 %     Weight 11/15/23 2053 127 lb 13.9 oz (58 kg)     Height 11/15/23 2053 5' 9 (1.753 m)     Head Circumference --      Peak Flow --      Pain Score 11/15/23 2052 0     Pain Loc --      Pain Education --      Exclude from Growth Chart --     Most recent vital signs: Vitals:   11/15/23 2052  BP: (!) 100/52  Pulse: 78  Resp: 18  Temp: 99 F (37.2 C)  SpO2: 100%    General: Awake, no distress. CV:  Good peripheral perfusion.  Resp:  Normal effort.  Abd:  No distention. Other:  He has bruising to the right side of his forehead which his daughter states was from an earlier fall and not this most recent 1.  His pleasant and cooperative answer my questions appropriately.  Neck supple full range of motion no midline tenderness to the CT or  L-spine.  Palpation of the thorax and abdomen elicits no pain.  He is able to range all extremities with full active range of motion.   ED Results / Procedures / Treatments   Labs (all labs ordered are listed, but only abnormal results are displayed) Labs Reviewed - No data to display    RADIOLOGY I independently reviewed and interpreted CT scan of the head see no obvious bleeding or midline shift I also reviewed radiologist's formal read.   PROCEDURES:  Critical Care performed: No  Procedures   MEDICATIONS ORDERED IN ED: Medications - No data to display IMPRESSION / MDM / ASSESSMENT AND PLAN / ED COURSE  I reviewed the triage vital signs and the nursing notes.                                Patient's presentation is most consistent with acute presentation with potential threat to life or bodily function.  Differential diagnosis includes,  but is not limited to, head injury, facial fracture, C-spine injury, ICH    MDM:    Patient with frequent falls gait instability with a what sounds like mechanical slip and fall.  He did have head injury.  He did not lose consciousness and a CT of the maxillofacial neck and head showed no emergency traumatic injuries.  He looks well and has no other complaints at this time.  Head to toe survey shows no other significant signs of injury - discharged back to facility         FINAL CLINICAL IMPRESSION(S) / ED DIAGNOSES   Final diagnoses:  Fall, initial encounter  Injury of head, initial encounter     Rx / DC Orders   ED Discharge Orders     None        Note:  This document was prepared using Dragon voice recognition software and may include unintentional dictation errors.    Cyrena Mylar, MD 11/16/23 [phone]

## 2023-11-16 NOTE — ED Notes (Signed)
 Called [facility] spoke with [facility]; pt is 2nd on list to transport

## 2023-11-28 ENCOUNTER — Emergency Department

## 2023-11-28 ENCOUNTER — Other Ambulatory Visit: Payer: Self-pay

## 2023-11-28 ENCOUNTER — Emergency Department
Admission: EM | Admit: 2023-11-28 | Discharge: 2023-11-29 | Disposition: A | Discharge status: Home / Self Care | Attending: Emergency Medicine | Admitting: Emergency Medicine

## 2023-11-28 DIAGNOSIS — S0990XA Unspecified injury of head, initial encounter: Secondary | ICD-10-CM | POA: Diagnosis present

## 2023-11-28 DIAGNOSIS — S0011XA Contusion of right eyelid and periocular area, initial encounter: Secondary | ICD-10-CM | POA: Insufficient documentation

## 2023-11-28 DIAGNOSIS — W19XXXA Unspecified fall, initial encounter: Secondary | ICD-10-CM | POA: Insufficient documentation

## 2023-11-28 DIAGNOSIS — F0390 Unspecified dementia without behavioral disturbance: Secondary | ICD-10-CM | POA: Insufficient documentation

## 2023-11-28 DIAGNOSIS — S0083XA Contusion of other part of head, initial encounter: Secondary | ICD-10-CM | POA: Diagnosis not present

## 2023-11-28 NOTE — ED Provider Notes (Signed)
 [facility] Provider Note    Event Date/Time   First MD Initiated Contact with Patient 11/28/23 2012     (approximate)   History   Fall   HPI  Isaac Ruiz is a 83 y.o. male who presents to the ED for evaluation of Fall   Review of PCP visit from 3 weeks ago.  History of dementia who resides at a local memory care unit.  Patient presents from his local memory care unit after an unwitnessed fall.  Facility told EMS he had new bruising to the right side of his head come to the ED.  Patient is pleasantly disoriented and has no complaints.  Denies pain anywhere.   Physical Exam   Triage Vital Signs: ED Triage Vitals  Encounter Vitals Group     BP 11/28/23 2013 (!) 110/45     Girls Systolic BP Percentile --      Girls Diastolic BP Percentile --      Boys Systolic BP Percentile --      Boys Diastolic BP Percentile --      Pulse Rate 11/28/23 2013 74     Resp 11/28/23 2013 18     Temp 11/28/23 2013 (!) 97.5 F (36.4 C)     Temp Source 11/28/23 2013 Axillary     SpO2 11/28/23 2013 100 %     Weight 11/28/23 2014 124 lb 9.6 oz (56.5 kg)     Height 11/28/23 2014 5' 9 (1.753 m)     Head Circumference --      Peak Flow --      Pain Score --      Pain Loc --      Pain Education --      Exclude from Growth Chart --     Most recent vital signs: Vitals:   11/28/23 2013  BP: (!) 110/45  Pulse: 74  Resp: 18  Temp: (!) 97.5 F (36.4 C)  SpO2: 100%    General: Awake, no distress.  CV:  Good peripheral perfusion.  Resp:  Normal effort.  Abd:  No distention.  MSK:  No deformity noted.  Neuro:  No focal deficits appreciated. Other:  Bruise to right temporal and periorbital region.  No proptosis or signs of ocular trauma or open globe.  No evidence of open injury, laceration.  Palpation of all 4 extremities without evidence of deformity, tenderness or trauma.   ED Results / Procedures / Treatments   Labs (all labs ordered are listed, but  only abnormal results are displayed) Labs Reviewed - No data to display  EKG   RADIOLOGY CT head interpreted by me without evidence of acute intracranial pathology CT cervical spine interpreted by me without evidence of fracture or dislocation  Official radiology report(s): CT HEAD WO CONTRAST ( ) Result Date: 11/28/2023 CLINICAL DATA:  Unwitnessed fall with head and neck trauma, hematoma and bruising noted right side of the forehead. EXAM: CT HEAD WITHOUT CONTRAST CT CERVICAL SPINE WITHOUT CONTRAST TECHNIQUE: Multidetector CT imaging of the head and cervical spine was performed following the standard protocol without intravenous contrast. Multiplanar CT image reconstructions of the cervical spine were also generated. RADIATION DOSE REDUCTION: This exam was performed according to the departmental dose-optimization program which includes automated exposure control, adjustment of the mA and/or kV according to patient size and/or use of iterative reconstruction technique. COMPARISON:  Head CT and cervical spine CT both 11/15/2023. FINDINGS: CT HEAD FINDINGS Brain: There is moderately developed cerebral atrophy with  atrophic ventriculomegaly and mild-to-moderate small vessel disease of the cerebral white matter. There is mild cerebellar volume loss. No cortical based acute infarct, hemorrhage, mass or mass effect is seen. No midline shift. Basal cisterns are clear. Vascular: The distal vertebral arteries and siphons are heavily calcified. No hyperdense central vessels. Skull: Negative for fractures or focal lesions. The right frontotemporal scalp hematoma noted on 11/15/2023 has partially resolved. No new scalp hematoma is seen. Sinuses/Orbits: There is mild membrane thickening in the ethmoids. Other paranasal sinuses, bilateral mastoid air cells and middle ears are clear. Negative orbits apart from old lens replacements. Other: None. CT CERVICAL SPINE FINDINGS Alignment: Chronic degenerative alignment  abnormalities with bone-on-bone anterior atlantodental joint space loss with osteophytes, and minimal grade 1 degenerative anterolisthesis C2-3, C3-4, C4-5 and C7-T1. No traumatic or further alignment abnormality. Skull base and vertebrae: No acute fracture is evident. No primary bone lesion or focal pathologic process. Soft tissues and spinal canal: No prevertebral fluid or swelling. No visible canal hematoma. There is moderate calcification in the proximal right cervical ICA. No laryngeal mass. Fatty replacement both parotid and submandibular glands. There is a 1.2 cm hypodense solid nodule in the left lobe of the thyroid gland. No follow-up imaging is recommended as the follow-up size criteria are not met. Disc levels: Chronic disc collapse C5-6 and C6-7, with bidirectional osteophytes. Slight disc space loss C4-5, C7-T1. Normal C2-3 and C3-4 disc heights. There are anterior osteophytes also at C4-5 and bidirectional C3-4 endplate spurring. There are posterior disc osteophyte complexes from C3-4 through C6-7 but no spondylotic cord compression. Multilevel facet joint and uncinate hypertrophy. Acquired foraminal stenosis is mild on the left at C2-3, severe on the right at C3-4, mild on the left at C4-5, moderate on the left at C5-6, mild on the left C6-7 and bilaterally moderate C7-T1. Upper chest: Biapical pleural-parenchymal scarring and calcifications. In the right lung apex there is a 6 mm ovoid solid nodule which is not visibly connected to the scarring process although still could be part of the scarring. The oldest study on file covering the area is the C-spine CT 09/18/2023 and it is unchanged at least since then. Scanning through the upper chest is otherwise negative as far as seen. Other: None. IMPRESSION: 1. No acute intracranial CT findings or depressed skull fractures. 2. Partial resolution of the right frontotemporal scalp hematoma noted on 11/15/2023. No new scalp hematoma. 3. Chronic atrophy and  small-vessel disease. 4. 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 5. 6 mm ovoid solid nodule in the right lung apex, unchanged at least since 09/18/2023. Equivocally could be part of the bilapical scarring process but is not directly connected to it. Non-contrast chest CT at 6-12 months is recommended. If the nodule is stable at time of repeat CT, then future CT at 18-24 months (from today's scan) is considered optional for low-risk patients, but is recommended for high-risk patients. This recommendation follows the consensus statement: Guidelines for Management of Incidental Pulmonary Nodules Detected on CT Images: From the Fleischner Society 2017; Radiology 2017; 284:228-243. 6. Aortic, distal vertebral artery and carotid atherosclerosis. Electronically Signed   By: Francis Quam M.D.   On: 11/28/2023 21:08   CT Cervical Spine Wo Contrast Result Date: 11/28/2023 CLINICAL DATA:  Unwitnessed fall with head and neck trauma, hematoma and bruising noted right side of the forehead. EXAM: CT HEAD WITHOUT CONTRAST CT CERVICAL SPINE WITHOUT CONTRAST TECHNIQUE: Multidetector CT imaging of the head and cervical spine  was performed following the standard protocol without intravenous contrast. Multiplanar CT image reconstructions of the cervical spine were also generated. RADIATION DOSE REDUCTION: This exam was performed according to the departmental dose-optimization program which includes automated exposure control, adjustment of the mA and/or kV according to patient size and/or use of iterative reconstruction technique. COMPARISON:  Head CT and cervical spine CT both 11/15/2023. FINDINGS: CT HEAD FINDINGS Brain: There is moderately developed cerebral atrophy with atrophic ventriculomegaly and mild-to-moderate small vessel disease of the cerebral white matter. There is mild cerebellar volume loss. No cortical based acute infarct, hemorrhage, mass or mass effect is  seen. No midline shift. Basal cisterns are clear. Vascular: The distal vertebral arteries and siphons are heavily calcified. No hyperdense central vessels. Skull: Negative for fractures or focal lesions. The right frontotemporal scalp hematoma noted on 11/15/2023 has partially resolved. No new scalp hematoma is seen. Sinuses/Orbits: There is mild membrane thickening in the ethmoids. Other paranasal sinuses, bilateral mastoid air cells and middle ears are clear. Negative orbits apart from old lens replacements. Other: None. CT CERVICAL SPINE FINDINGS Alignment: Chronic degenerative alignment abnormalities with bone-on-bone anterior atlantodental joint space loss with osteophytes, and minimal grade 1 degenerative anterolisthesis C2-3, C3-4, C4-5 and C7-T1. No traumatic or further alignment abnormality. Skull base and vertebrae: No acute fracture is evident. No primary bone lesion or focal pathologic process. Soft tissues and spinal canal: No prevertebral fluid or swelling. No visible canal hematoma. There is moderate calcification in the proximal right cervical ICA. No laryngeal mass. Fatty replacement both parotid and submandibular glands. There is a 1.2 cm hypodense solid nodule in the left lobe of the thyroid gland. No follow-up imaging is recommended as the follow-up size criteria are not met. Disc levels: Chronic disc collapse C5-6 and C6-7, with bidirectional osteophytes. Slight disc space loss C4-5, C7-T1. Normal C2-3 and C3-4 disc heights. There are anterior osteophytes also at C4-5 and bidirectional C3-4 endplate spurring. There are posterior disc osteophyte complexes from C3-4 through C6-7 but no spondylotic cord compression. Multilevel facet joint and uncinate hypertrophy. Acquired foraminal stenosis is mild on the left at C2-3, severe on the right at C3-4, mild on the left at C4-5, moderate on the left at C5-6, mild on the left C6-7 and bilaterally moderate C7-T1. Upper chest: Biapical pleural-parenchymal  scarring and calcifications. In the right lung apex there is a 6 mm ovoid solid nodule which is not visibly connected to the scarring process although still could be part of the scarring. The oldest study on file covering the area is the C-spine CT 09/18/2023 and it is unchanged at least since then. Scanning through the upper chest is otherwise negative as far as seen. Other: None. IMPRESSION: 1. No acute intracranial CT findings or depressed skull fractures. 2. Partial resolution of the right frontotemporal scalp hematoma noted on 11/15/2023. No new scalp hematoma. 3. Chronic atrophy and small-vessel disease. 4. 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 5. 6 mm ovoid solid nodule in the right lung apex, unchanged at least since 09/18/2023. Equivocally could be part of the bilapical scarring process but is not directly connected to it. Non-contrast chest CT at 6-12 months is recommended. If the nodule is stable at time of repeat CT, then future CT at 18-24 months (from today's scan) is considered optional for low-risk patients, but is recommended for high-risk patients. This recommendation follows the consensus statement: Guidelines for Management of Incidental Pulmonary Nodules Detected on CT Images: From the Fleischner  Society 2017; Radiology 2017; 284:228-243. 6. Aortic, distal vertebral artery and carotid atherosclerosis. Electronically Signed   By: Francis Quam M.D.   On: 11/28/2023 21:08    PROCEDURES and INTERVENTIONS:  Procedures  Medications - No data to display   IMPRESSION / MDM / ASSESSMENT AND PLAN / ED COURSE  I reviewed the triage vital signs and the nursing notes.  Differential diagnosis includes, but is not limited to, fracture, ICH, syncope  {Patient presents with symptoms of an acute illness or injury that is potentially life-threatening.  Patient presents after unwitnessed fall and reported head injury without evidence of  acute pathology suitable for outpatient management.  Old bruising to the right side of the head.  CT head and neck are reassuring.  No other signs of acute trauma on exam.  Suitable for outpatient management.  Clinical Course as of 11/28/23 2116  Mon Nov 28, 2023  2028 Daughter, Isaac Ruiz, at the bedside.  She had a similar story, struck his head during a fall.  They heard it happen and found him.  She has no other concerns [DS]  2114 Reassessed and discussed with daughter.  She is appreciative and has no other concerns.  Discussed return precautions [DS]    Clinical Course User Index [DS] Claudene Rover, MD     FINAL CLINICAL IMPRESSION(S) / ED DIAGNOSES   Final diagnoses:  Fall, initial encounter  Injury of head, initial encounter     Rx / DC Orders   ED Discharge Orders     None        Note:  This document was prepared using Dragon voice recognition software and may include unintentional dictation errors.   Claudene Rover, MD 11/28/23 2116

## 2023-11-28 NOTE — ED Notes (Signed)
 Called [facility] to transport patient back to [facility] spoke with Holley

## 2023-11-28 NOTE — ED Triage Notes (Signed)
 Pt presented to ED via EMS from [facility] with c/o unwitnessed fall. Hematoma/ bruising noted to right forehead. In memory care unit and A&Ox baseline per EMS.

## 2023-11-28 NOTE — ED Notes (Signed)
 Pt provided with pillow. Daughter at bedside.

## 2023-12-05 ENCOUNTER — Other Ambulatory Visit: Payer: Self-pay

## 2023-12-05 ENCOUNTER — Emergency Department

## 2023-12-05 ENCOUNTER — Emergency Department
Admission: EM | Admit: 2023-12-05 | Discharge: 2023-12-05 | Disposition: A | Discharge status: Home / Self Care | Source: Skilled Nursing Facility | Attending: Emergency Medicine | Admitting: Emergency Medicine

## 2023-12-05 DIAGNOSIS — S0181XA Laceration without foreign body of other part of head, initial encounter: Secondary | ICD-10-CM | POA: Insufficient documentation

## 2023-12-05 DIAGNOSIS — S0083XA Contusion of other part of head, initial encounter: Secondary | ICD-10-CM

## 2023-12-05 DIAGNOSIS — F0390 Unspecified dementia without behavioral disturbance: Secondary | ICD-10-CM | POA: Diagnosis not present

## 2023-12-05 DIAGNOSIS — W01198A Fall on same level from slipping, tripping and stumbling with subsequent striking against other object, initial encounter: Secondary | ICD-10-CM | POA: Diagnosis not present

## 2023-12-05 DIAGNOSIS — W19XXXA Unspecified fall, initial encounter: Secondary | ICD-10-CM

## 2023-12-05 DIAGNOSIS — S0990XA Unspecified injury of head, initial encounter: Secondary | ICD-10-CM | POA: Diagnosis present

## 2023-12-05 NOTE — ED Provider Notes (Signed)
 [facility] Provider Note    Event Date/Time   First MD Initiated Contact with Patient 12/05/23 [phone]     (approximate)   History   Fall  Level 5 caveat:  history/ROS limited by chronic dementia  HPI Isaac Ruiz is a 83 y.o. male who has a history of frequent falls and advanced dementia and who comes with a [employer] DNR form.  He reportedly fell at [facility].  He got himself back in bed but he had an acute hematoma with a laceration on his on the right part of his forehead.  He just about a week ago had a fall and struck the same area.  His daughter is now at bedside and is his healthcare power of attorney and caregiver.     Physical Exam   Triage Vital Signs: ED Triage Vitals  Encounter Vitals Group     BP 12/05/23 0405 138/60     Girls Systolic BP Percentile --      Girls Diastolic BP Percentile --      Boys Systolic BP Percentile --      Boys Diastolic BP Percentile --      Pulse Rate 12/05/23 0405 68     Resp 12/05/23 0405 14     Temp 12/05/23 0405 99.4 F (37.4 C)     Temp Source 12/05/23 0405 Oral     SpO2 12/05/23 0405 100 %     Weight 12/05/23 0405 61.1 kg (134 lb 12.8 oz)     Height 12/05/23 0405 1.702 m (5' 7)     Head Circumference --      Peak Flow --      Pain Score 12/05/23 0358 0     Pain Loc --      Pain Education --      Exclude from Growth Chart --     Most recent vital signs: Vitals:   12/05/23 0405 12/05/23 0445  BP: 138/60   Pulse: 68 66  Resp: 14   Temp: 99.4 F (37.4 C)   SpO2: 100% 100%    General: Awake and responds to informal stimuli.  According to his daughter he is at his baseline.  He will occasionally answer questions but his answers are mostly nonsensical.  He follows some commands but not others. CV:  Good peripheral perfusion.  Regular rate and rhythm. Resp:  Normal effort. no accessory muscle usage nor intercostal retractions.   Abd:  No distention.  No tenderness to palpation of the  abdomen. Other:  Patient has a large right forehead hematoma with a small, relatively superficial stellate laceration to the middle of it, bleeding well-controlled.  No other physical injuries at this time.  No tenderness to palpation or reaction to palpation of his cervical spine.  He has multiple old bruises and a couple of Band-Aids on his arms from prior falls but no other acute musculoskeletal injuries that I can appreciate.  He is moving all 4 of his extremities and will do so to command without any evidence of pain or tenderness.   ED Results / Procedures / Treatments   Labs (all labs ordered are listed, but only abnormal results are displayed) Labs Reviewed - No data to display   RADIOLOGY I independently viewed and interpreted the patient's head CT and cervical spine CT.  I see no evidence of acute intracranial hemorrhage or skull fracture or cervical spine injury.  Radiologist agreed, also commented on the presence of pulmonary nodule(s).  PROCEDURES:  Critical Care performed: No  .Laceration Repair  Date/Time: 12/05/2023 4:46 AM  Performed by: Gordan Huxley, MD Authorized by: Gordan Huxley, MD   Consent:    Consent obtained:  Verbal   Consent given by:  Healthcare agent   Risks discussed:  Infection, need for additional repair, pain and poor cosmetic result Universal protocol:    Patient identity confirmed:  Arm band Laceration details:    Length (cm):  1 (stellate) Skin repair:    Repair method:  Tissue adhesive and Steri-Strips   Number of Steri-Strips:  2 Approximation:    Approximation:  Close Repair type:    Repair type:  Simple Post-procedure details:    Dressing:  Open (no dressing)   Procedure completion:  Tolerated well, no immediate complications     IMPRESSION / MDM / ASSESSMENT AND PLAN / ED COURSE  I reviewed the triage vital signs and the nursing notes.                              Differential diagnosis includes, but is not limited to,  hematoma, laceration, fracture, acute intracranial hemorrhage.  Patient's presentation is most consistent with acute presentation with potential threat to life or bodily function.  Labs/studies ordered: CT head, CT cervical spine  Interventions/Medications given:  Medications - No data to display  (Note:  hospital course my include additional interventions and/or labs/studies not listed above.)   Vital signs are reassuring and patient is in no distress.  Has a history of similar falls.  CT scans with no acute findings as documented above.  I updated the patient's daughter about the pulmonary nodule but she agreed that there was no need to refer him to pulmonary nodule clinic given his goals of care.  The patient's medical screening exam is reassuring with no indication of an emergent medical condition requiring hospitalization or additional evaluation at this point.  The patient is safe and appropriate for discharge and outpatient follow up.  Daughter is very comfortable with the plan.         FINAL CLINICAL IMPRESSION(S) / ED DIAGNOSES   Final diagnoses:  Fall, initial encounter  Minor head injury, initial encounter  Traumatic hematoma of forehead, initial encounter  Forehead laceration, initial encounter     Rx / DC Orders   ED Discharge Orders     None        Note:  This document was prepared using Dragon voice recognition software and may include unintentional dictation errors.   Gordan Huxley, MD 12/05/23 [phone]

## 2023-12-05 NOTE — ED Triage Notes (Signed)
 Pt BIB [facility] from [facility] for unwitnessed fall, - thinners, LOC? Hx dementia, skin tears to rt arm and hematoma to rt forehead noted. DNR form is at bedside.

## 2023-12-05 NOTE — Discharge Instructions (Signed)
 Isaac Ruiz had another fall, but fortunately his CT scans were reassuring.  Please try to keep his wound and hematoma clean and dry until the Steri-Strip tape falls off.  Have him follow-up with his regular doctor at the next available opportunity

## 2024-03-10 DEATH — deceased
